# Patient Record
Sex: Female | Born: 1990 | Race: White | Hispanic: No | Marital: Married | Smoking: Former smoker
Health system: Southern US, Community
[De-identification: ages and names within clinical notes are randomized; demographics above are authoritative.]

## PROBLEM LIST (undated history)

## (undated) ENCOUNTER — Inpatient Hospital Stay (HOSPITAL_COMMUNITY): Payer: Self-pay

## (undated) DIAGNOSIS — R519 Headache, unspecified: Secondary | ICD-10-CM

## (undated) DIAGNOSIS — L409 Psoriasis, unspecified: Secondary | ICD-10-CM

## (undated) DIAGNOSIS — K759 Inflammatory liver disease, unspecified: Secondary | ICD-10-CM

## (undated) DIAGNOSIS — E876 Hypokalemia: Secondary | ICD-10-CM

## (undated) DIAGNOSIS — R7989 Other specified abnormal findings of blood chemistry: Secondary | ICD-10-CM

## (undated) HISTORY — DX: Hypokalemia: E87.6

## (undated) HISTORY — DX: Inflammatory liver disease, unspecified: K75.9

## (undated) HISTORY — DX: Psoriasis, unspecified: L40.9

## (undated) HISTORY — DX: Other specified abnormal findings of blood chemistry: R79.89

## (undated) HISTORY — PX: NO PAST SURGERIES: SHX2092

---
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
## 2018-07-07 ENCOUNTER — Encounter: Payer: Self-pay | Admitting: Family Medicine

## 2018-07-07 ENCOUNTER — Ambulatory Visit
Admission: EM | Admit: 2018-07-07 | Discharge: 2018-07-07 | Disposition: A | Discharge status: Home / Self Care | Payer: 59 | Attending: Family Medicine | Admitting: Family Medicine

## 2018-07-07 ENCOUNTER — Other Ambulatory Visit: Payer: Self-pay

## 2018-07-07 DIAGNOSIS — J40 Bronchitis, not specified as acute or chronic: Secondary | ICD-10-CM | POA: Diagnosis not present

## 2018-07-07 DIAGNOSIS — J111 Influenza due to unidentified influenza virus with other respiratory manifestations: Secondary | ICD-10-CM

## 2018-07-07 DIAGNOSIS — H65113 Acute and subacute allergic otitis media (mucoid) (sanguinous) (serous), bilateral: Secondary | ICD-10-CM

## 2018-07-07 DIAGNOSIS — R69 Illness, unspecified: Principal | ICD-10-CM

## 2018-07-07 MED ORDER — PREDNISONE 20 MG PO TABS: ORAL_TABLET | ORAL | 0 refills | Status: DC

## 2018-07-07 MED ORDER — AMOXICILLIN 875 MG PO TABS: 875.0000 mg | ORAL_TABLET | Freq: Two times a day (BID) | ORAL | 0 refills | Status: DC

## 2018-07-07 MED ORDER — BENZONATATE 100 MG PO CAPS: 100.0000 mg | ORAL_CAPSULE | Freq: Three times a day (TID) | ORAL | 0 refills | Status: DC | PRN

## 2018-07-07 NOTE — ED Provider Notes (Signed)
[facility]    CSN: [id number] Arrival date & time: 07/07/18  1003     History   Chief Complaint Chief Complaint  Patient presents with  . Cough    low grade fever  . Nasal Congestion    HPI Heather Lawrence is a 28 y.o. female.   28 yo woman with cough and fever.  Began 3 days ago.  Patient is having fullness in her ears and a violent cough.  She initially had a fever but this is resolved.  Non-smoker and no history of asthma.  Not short of breath but not sleeping well either.  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     History reviewed. No pertinent past medical history.  There are no active problems to display for this patient.   History reviewed. No pertinent surgical history.  OB History   No obstetric history on file.      Home Medications    Prior to Admission medications   Medication Sig Start Date End Date Taking? Authorizing Provider  amoxicillin (AMOXIL) 875 MG tablet Take 1 tablet (875 mg total) by mouth 2 (two) times daily. 07/07/18   Elvina Sidle, MD  benzonatate (TESSALON) 100 MG capsule Take 1-2 capsules (100-200 mg total) by mouth 3 (three) times daily as needed for cough. 07/07/18   Elvina Sidle, MD  predniSONE (DELTASONE) 20 MG tablet Two daily with food 07/07/18   Elvina Sidle, MD    Family History No family history on file.  Social History Social History   Tobacco Use  . Smoking status: Not on file  Substance Use Topics  . Alcohol use: Not on file  . Drug use: Not on file     Allergies   Patient has no allergy information on record.   Review of Systems Review of Systems   Physical Exam Triage Vital Signs ED Triage Vitals  Enc Vitals Group     BP      Pulse      Resp      Temp      Temp src      SpO2      Weight      Height      Head Circumference      Peak Flow      Pain Score      Pain Loc      Pain Edu?      Excl. in GC?    No data found.  Updated Vital  Signs BP 133/81 (BP Location: Right Arm)   Pulse 84   Temp 97.8 F (36.6 C) (Oral)   Resp 16   Ht 5' (1.524 m)   Wt 55.8 kg   LMP  (Exact Date)   SpO2 99%   BMI 24.02 kg/m    Physical Exam Vitals signs and nursing note reviewed.  Constitutional:      General: She is not in acute distress.    Appearance: Normal appearance. She is normal weight.  HENT:     Head: Normocephalic.     Comments: Bilateral mucoid appearing fluid behind each of her TMs    Nose: Congestion present.     Mouth/Throat:     Pharynx: Oropharynx is clear.  Eyes:     Conjunctiva/sclera: Conjunctivae normal.  Neck:     Musculoskeletal: Normal range of motion and neck supple.  Cardiovascular:     Rate and Rhythm: Normal rate and regular rhythm.     Heart sounds:  Normal heart sounds.  Pulmonary:     Effort: Pulmonary effort is normal.     Breath sounds: Normal breath sounds.  Musculoskeletal: Normal range of motion.  Skin:    General: Skin is warm and dry.  Neurological:     General: No focal deficit present.     Mental Status: She is alert.  Psychiatric:        Mood and Affect: Mood normal.      UC Treatments / Results  Labs (all labs ordered are listed, but only abnormal results are displayed) Labs Reviewed - No data to display  EKG None  Radiology No results found.  Procedures Procedures (including critical care time)  Medications Ordered in UC Medications - No data to display  Initial Impression / Assessment and Plan / UC Course  I have reviewed the triage vital signs and the nursing notes.  Pertinent labs & imaging results that were available during my care of the patient were reviewed by me and considered in my medical decision making (see chart for details).    Final Clinical Impressions(s) / UC Diagnoses   Final diagnoses:  Influenza-like illness  Acute mucoid otitis media of both ears  Bronchitis   Discharge Instructions   None    ED Prescriptions    Medication  Sig Dispense Auth. Provider   amoxicillin (AMOXIL) 875 MG tablet Take 1 tablet (875 mg total) by mouth 2 (two) times daily. 20 tablet Elvina Sidle, MD   predniSONE (DELTASONE) 20 MG tablet Two daily with food 10 tablet Elvina Sidle, MD   benzonatate (TESSALON) 100 MG capsule Take 1-2 capsules (100-200 mg total) by mouth 3 (three) times daily as needed for cough. 40 capsule Elvina Sidle, MD     Controlled Substance Prescriptions [place] Controlled Substance Registry consulted? Not Applicable   Elvina Sidle, MD 07/07/18 1039

## 2018-07-07 NOTE — ED Triage Notes (Signed)
Per pt she has been having cough, congestion, body aches and low grade fevers. Pt stated she has been feeling very bad.Not getting any better over the last few days.

## 2019-04-30 DIAGNOSIS — Z3040 Encounter for surveillance of contraceptives, unspecified: Secondary | ICD-10-CM | POA: Diagnosis not present

## 2019-04-30 DIAGNOSIS — Z01419 Encounter for gynecological examination (general) (routine) without abnormal findings: Secondary | ICD-10-CM | POA: Diagnosis not present

## 2019-04-30 DIAGNOSIS — Z1389 Encounter for screening for other disorder: Secondary | ICD-10-CM | POA: Diagnosis not present

## 2019-04-30 DIAGNOSIS — Z6821 Body mass index (BMI) 21.0-21.9, adult: Secondary | ICD-10-CM | POA: Diagnosis not present

## 2019-05-21 ENCOUNTER — Telehealth: Payer: 59 | Admitting: Physician Assistant

## 2019-05-21 DIAGNOSIS — R05 Cough: Secondary | ICD-10-CM | POA: Diagnosis not present

## 2019-05-21 DIAGNOSIS — R059 Cough, unspecified: Secondary | ICD-10-CM

## 2019-05-21 MED ORDER — PREDNISONE 5 MG PO TABS: ORAL_TABLET | ORAL | 0 refills | Status: DC

## 2019-05-21 MED ORDER — BENZONATATE 100 MG PO CAPS: 100.0000 mg | ORAL_CAPSULE | Freq: Three times a day (TID) | ORAL | 0 refills | Status: DC | PRN

## 2019-05-21 NOTE — Progress Notes (Signed)
We are sorry that you are not feeling well.  Here is how we plan to help!  Based on your presentation I believe you most likely have A cough due to a virus.  This is called viral bronchitis and is best treated by rest, plenty of fluids and control of the cough.  You may use Ibuprofen or Tylenol as directed to help your symptoms.     In addition you may use A prescription cough medication called Tessalon Perles 100mg . You may take 1-2 capsules every 8 hours as needed for your cough.  Prednisone 5 mg daily for 6 days (see taper instructions below)  Directions for 6 day taper: Day 1: 2 tablets before breakfast, 1 after both lunch & dinner and 2 at bedtime Day 2: 1 tab before breakfast, 1 after both lunch & dinner and 2 at bedtime Day 3: 1 tab at each meal & 1 at bedtime Day 4: 1 tab at breakfast, 1 at lunch, 1 at bedtime Day 5: 1 tab at breakfast & 1 tab at bedtime Day 6: 1 tab at breakfast   From your responses in the eVisit questionnaire you describe inflammation in the upper respiratory tract which is causing a significant cough.  This is commonly called Bronchitis and has four common causes:    Allergies  Viral Infections  Acid Reflux  Bacterial Infection Allergies, viruses and acid reflux are treated by controlling symptoms or eliminating the cause. An example might be a cough caused by taking certain blood pressure medications. You stop the cough by changing the medication. Another example might be a cough caused by acid reflux. Controlling the reflux helps control the cough.    HOME CARE . Only take medications as instructed by your medical team. . Complete the entire course of an antibiotic. . Drink plenty of fluids and get plenty of rest. . Avoid close contacts especially the very young and the elderly . Cover your mouth if you cough or cough into your sleeve. . Always remember to wash your hands . A steam or ultrasonic humidifier can help congestion.   GET HELP RIGHT AWAY  IF: . You develop worsening fever. . You become short of breath . You cough up blood. . Your symptoms persist after you have completed your treatment plan MAKE SURE YOU   Understand these instructions.  Will watch your condition.  Will get help right away if you are not doing well or get worse.  Your e-visit answers were reviewed by a board certified advanced clinical practitioner to complete your personal care plan.  Depending on the condition, your plan could have included both over the counter or prescription medications. If there is a problem please reply  once you have received a response from your provider. Your safety is important to [place].  If you have drug allergies check your prescription carefully.    You can use MyChart to ask questions about today's visit, request a non-urgent call back, or ask for a work or school excuse for 24 hours related to this e-Visit. If it has been greater than 24 hours you will need to follow up with your provider, or enter a new e-Visit to address those concerns. You will get an e-mail in the next two days asking about your experience.  I hope that your e-visit has been valuable and will speed your recovery. Thank you for using e-visits.  Greater than 5 minutes, yet less than 10 minutes of time have been spent researching, coordinating and  implementing care for this patient today.

## 2020-05-18 ENCOUNTER — Telehealth: Payer: Self-pay | Admitting: Internal Medicine

## 2020-05-18 MED ORDER — AMOXICILLIN 875 MG PO TABS: 875.0000 mg | ORAL_TABLET | Freq: Two times a day (BID) | ORAL | 0 refills | Status: DC

## 2020-05-18 MED ORDER — PREDNISONE 20 MG PO TABS: 40.0000 mg | ORAL_TABLET | Freq: Every day | ORAL | 0 refills | Status: DC

## 2020-05-18 NOTE — Telephone Encounter (Signed)
Bronchitis symptoms persistent. Responds well to steroids/abx in past. Will give trial of tx with instructions to f/u with PCP if no improvement.

## 2020-05-28 DIAGNOSIS — Z1389 Encounter for screening for other disorder: Secondary | ICD-10-CM | POA: Diagnosis not present

## 2020-05-28 DIAGNOSIS — Z1151 Encounter for screening for human papillomavirus (HPV): Secondary | ICD-10-CM | POA: Diagnosis not present

## 2020-05-28 DIAGNOSIS — Z124 Encounter for screening for malignant neoplasm of cervix: Secondary | ICD-10-CM | POA: Diagnosis not present

## 2020-05-28 DIAGNOSIS — Z6822 Body mass index (BMI) 22.0-22.9, adult: Secondary | ICD-10-CM | POA: Diagnosis not present

## 2020-05-28 DIAGNOSIS — Z202 Contact with and (suspected) exposure to infections with a predominantly sexual mode of transmission: Secondary | ICD-10-CM | POA: Diagnosis not present

## 2020-05-28 DIAGNOSIS — Z01419 Encounter for gynecological examination (general) (routine) without abnormal findings: Secondary | ICD-10-CM | POA: Diagnosis not present

## 2020-05-28 DIAGNOSIS — Z113 Encounter for screening for infections with a predominantly sexual mode of transmission: Secondary | ICD-10-CM | POA: Diagnosis not present

## 2020-09-03 DIAGNOSIS — L638 Other alopecia areata: Secondary | ICD-10-CM | POA: Diagnosis not present

## 2020-09-03 DIAGNOSIS — L400 Psoriasis vulgaris: Secondary | ICD-10-CM | POA: Diagnosis not present

## 2020-09-14 ENCOUNTER — Emergency Department (HOSPITAL_COMMUNITY)
Admission: EM | Admit: 2020-09-14 | Discharge: 2020-09-14 | Disposition: A | Discharge status: Home / Self Care | Payer: 59 | Source: Home / Self Care | Attending: Emergency Medicine | Admitting: Emergency Medicine

## 2020-09-14 ENCOUNTER — Emergency Department (HOSPITAL_COMMUNITY): Payer: 59

## 2020-09-14 ENCOUNTER — Inpatient Hospital Stay (HOSPITAL_COMMUNITY)
Admission: EM | Admit: 2020-09-14 | Discharge: 2020-09-15 | DRG: 443 | Disposition: A | Discharge status: Home / Self Care | Payer: 59 | Attending: Internal Medicine | Admitting: Internal Medicine

## 2020-09-14 ENCOUNTER — Other Ambulatory Visit: Payer: Self-pay

## 2020-09-14 ENCOUNTER — Encounter (HOSPITAL_COMMUNITY): Payer: Self-pay

## 2020-09-14 DIAGNOSIS — R112 Nausea with vomiting, unspecified: Secondary | ICD-10-CM | POA: Insufficient documentation

## 2020-09-14 DIAGNOSIS — E861 Hypovolemia: Secondary | ICD-10-CM | POA: Diagnosis present

## 2020-09-14 DIAGNOSIS — R1011 Right upper quadrant pain: Secondary | ICD-10-CM | POA: Diagnosis not present

## 2020-09-14 DIAGNOSIS — R197 Diarrhea, unspecified: Secondary | ICD-10-CM | POA: Insufficient documentation

## 2020-09-14 DIAGNOSIS — R509 Fever, unspecified: Secondary | ICD-10-CM | POA: Insufficient documentation

## 2020-09-14 DIAGNOSIS — E860 Dehydration: Secondary | ICD-10-CM | POA: Diagnosis not present

## 2020-09-14 DIAGNOSIS — R748 Abnormal levels of other serum enzymes: Secondary | ICD-10-CM

## 2020-09-14 DIAGNOSIS — I959 Hypotension, unspecified: Secondary | ICD-10-CM | POA: Diagnosis present

## 2020-09-14 DIAGNOSIS — K7200 Acute and subacute hepatic failure without coma: Principal | ICD-10-CM | POA: Diagnosis present

## 2020-09-14 DIAGNOSIS — L409 Psoriasis, unspecified: Secondary | ICD-10-CM | POA: Diagnosis not present

## 2020-09-14 DIAGNOSIS — Z20822 Contact with and (suspected) exposure to covid-19: Secondary | ICD-10-CM | POA: Insufficient documentation

## 2020-09-14 DIAGNOSIS — K759 Inflammatory liver disease, unspecified: Secondary | ICD-10-CM

## 2020-09-14 DIAGNOSIS — N83201 Unspecified ovarian cyst, right side: Secondary | ICD-10-CM | POA: Diagnosis present

## 2020-09-14 DIAGNOSIS — E876 Hypokalemia: Secondary | ICD-10-CM | POA: Diagnosis not present

## 2020-09-14 DIAGNOSIS — D72829 Elevated white blood cell count, unspecified: Secondary | ICD-10-CM | POA: Diagnosis present

## 2020-09-14 DIAGNOSIS — R1010 Upper abdominal pain, unspecified: Secondary | ICD-10-CM | POA: Diagnosis not present

## 2020-09-14 DIAGNOSIS — F17290 Nicotine dependence, other tobacco product, uncomplicated: Secondary | ICD-10-CM | POA: Diagnosis present

## 2020-09-14 DIAGNOSIS — A084 Viral intestinal infection, unspecified: Secondary | ICD-10-CM | POA: Diagnosis not present

## 2020-09-14 DIAGNOSIS — R1013 Epigastric pain: Secondary | ICD-10-CM

## 2020-09-14 DIAGNOSIS — R7401 Elevation of levels of liver transaminase levels: Secondary | ICD-10-CM

## 2020-09-14 DIAGNOSIS — R109 Unspecified abdominal pain: Secondary | ICD-10-CM | POA: Diagnosis not present

## 2020-09-14 LAB — URINALYSIS, ROUTINE W REFLEX MICROSCOPIC
Bacteria, UA: NONE SEEN
Bilirubin Urine: NEGATIVE
Bilirubin Urine: NEGATIVE
Glucose, UA: NEGATIVE mg/dL
Glucose, UA: NEGATIVE mg/dL
Ketones, ur: 5 mg/dL — AB
Ketones, ur: NEGATIVE mg/dL
Leukocytes,Ua: NEGATIVE
Leukocytes,Ua: NEGATIVE
Nitrite: NEGATIVE
Nitrite: NEGATIVE
Protein, ur: NEGATIVE mg/dL
Protein, ur: NEGATIVE mg/dL
Specific Gravity, Urine: 1.018 (ref 1.005–1.030)
Specific Gravity, Urine: 1.003 — ABNORMAL LOW (ref 1.005–1.030)
pH: 5 (ref 5.0–8.0)
pH: 7 (ref 5.0–8.0)

## 2020-09-14 LAB — COMPREHENSIVE METABOLIC PANEL
ALT: 64 U/L — ABNORMAL HIGH (ref 0–44)
ALT: 277 U/L — ABNORMAL HIGH (ref 0–44)
AST: 103 U/L — ABNORMAL HIGH (ref 15–41)
AST: 336 U/L — ABNORMAL HIGH (ref 15–41)
Albumin: 3.7 g/dL (ref 3.5–5.0)
Albumin: 3.6 g/dL (ref 3.5–5.0)
Alkaline Phosphatase: 53 U/L (ref 38–126)
Alkaline Phosphatase: 91 U/L (ref 38–126)
Anion gap: 8 (ref 5–15)
Anion gap: 9 (ref 5–15)
BUN: 5 mg/dL — ABNORMAL LOW (ref 6–20)
BUN: <5 mg/dL — ABNORMAL LOW (ref 6–20)
CO2: 28 mmol/L (ref 22–32)
CO2: 23 mmol/L (ref 22–32)
Calcium: 8.5 mg/dL — ABNORMAL LOW (ref 8.9–10.3)
Calcium: 8.8 mg/dL — ABNORMAL LOW (ref 8.9–10.3)
Chloride: 100 mmol/L (ref 98–111)
Chloride: 105 mmol/L (ref 98–111)
Creatinine, Ser: 0.98 mg/dL (ref 0.44–1.00)
Creatinine, Ser: 0.88 mg/dL (ref 0.44–1.00)
GFR, Estimated: >60 mL/min (ref >60)
GFR, Estimated: >60 mL/min (ref >60)
Glucose, Bld: 94 mg/dL (ref 70–99)
Glucose, Bld: 112 mg/dL — ABNORMAL HIGH (ref 70–99)
Potassium: 3.2 mmol/L — ABNORMAL LOW (ref 3.5–5.1)
Potassium: 3.8 mmol/L (ref 3.5–5.1)
Sodium: 136 mmol/L (ref 135–145)
Sodium: 137 mmol/L (ref 135–145)
Total Bilirubin: 0.7 mg/dL (ref 0.3–1.2)
Total Bilirubin: 0.9 mg/dL (ref 0.3–1.2)
Total Protein: 6.5 g/dL (ref 6.5–8.1)
Total Protein: 6.9 g/dL (ref 6.5–8.1)

## 2020-09-14 LAB — CBC
HCT: 42.7 % (ref 36.0–46.0)
Hemoglobin: 14.5 g/dL (ref 12.0–15.0)
MCH: 31.7 pg (ref 26.0–34.0)
MCHC: 34 g/dL (ref 30.0–36.0)
MCV: 93.2 fL (ref 80.0–100.0)
Platelets: 179 10*3/uL (ref 150–400)
RBC: 4.58 MIL/uL (ref 3.87–5.11)
RDW: 11.8 % (ref 11.5–15.5)
WBC: 8.3 10*3/uL (ref 4.0–10.5)
nRBC: 0 % (ref 0.0–0.2)

## 2020-09-14 LAB — CBC WITH DIFFERENTIAL/PLATELET
Abs Immature Granulocytes: 0.03 10*3/uL (ref 0.00–0.07)
Basophils Absolute: 0 10*3/uL (ref 0.0–0.1)
Basophils Relative: 0 %
Eosinophils Absolute: 0.1 10*3/uL (ref 0.0–0.5)
Eosinophils Relative: 1 %
HCT: 42.3 % (ref 36.0–46.0)
Hemoglobin: 14.7 g/dL (ref 12.0–15.0)
Immature Granulocytes: 0 %
Lymphocytes Relative: 13 %
Lymphs Abs: 1.4 10*3/uL (ref 0.7–4.0)
MCH: 32 pg (ref 26.0–34.0)
MCHC: 34.8 g/dL (ref 30.0–36.0)
MCV: 92.2 fL (ref 80.0–100.0)
Monocytes Absolute: 0.6 10*3/uL (ref 0.1–1.0)
Monocytes Relative: 6 %
Neutro Abs: 9.3 10*3/uL — ABNORMAL HIGH (ref 1.7–7.7)
Neutrophils Relative %: 80 %
Platelets: 199 10*3/uL (ref 150–400)
RBC: 4.59 MIL/uL (ref 3.87–5.11)
RDW: 11.8 % (ref 11.5–15.5)
WBC: 11.5 10*3/uL — ABNORMAL HIGH (ref 4.0–10.5)
nRBC: 0 % (ref 0.0–0.2)

## 2020-09-14 LAB — I-STAT BETA HCG BLOOD, ED (MC, WL, AP ONLY)
I-stat hCG, quantitative: <5 m[IU]/mL (ref <5)
I-stat hCG, quantitative: <5 m[IU]/mL (ref <5)

## 2020-09-14 LAB — PROTIME-INR
INR: 1.2 (ref 0.8–1.2)
Prothrombin Time: 15.6 seconds — ABNORMAL HIGH (ref 11.4–15.2)

## 2020-09-14 LAB — MONONUCLEOSIS SCREEN: Mono Screen: NEGATIVE

## 2020-09-14 LAB — RESP PANEL BY RT-PCR (FLU A&B, COVID) ARPGX2
Influenza A by PCR: NEGATIVE
Influenza B by PCR: NEGATIVE
SARS Coronavirus 2 by RT PCR: NEGATIVE

## 2020-09-14 LAB — HEPATITIS PANEL, ACUTE
HCV Ab: NONREACTIVE
Hep A IgM: NONREACTIVE
Hep B C IgM: NONREACTIVE
Hepatitis B Surface Ag: NONREACTIVE

## 2020-09-14 LAB — HIV ANTIBODY (ROUTINE TESTING W REFLEX): HIV Screen 4th Generation wRfx: NONREACTIVE

## 2020-09-14 LAB — LIPASE, BLOOD
Lipase: 32 U/L (ref 11–51)
Lipase: 30 U/L (ref 11–51)

## 2020-09-14 MED ORDER — SODIUM CHLORIDE 0.9 % IV SOLN: INTRAVENOUS | Status: DC

## 2020-09-14 MED ORDER — ACETAMINOPHEN 500 MG PO TABS: 500.0000 mg | ORAL_TABLET | Freq: Four times a day (QID) | ORAL | Status: DC | PRN

## 2020-09-14 MED ORDER — DOXYCYCLINE HYCLATE 100 MG PO TABS
100.0000 mg | ORAL_TABLET | Freq: Once | ORAL | Status: AC
  Administered 2020-09-14: 100 mg via ORAL
  Filled 2020-09-14: qty 1

## 2020-09-14 MED ORDER — MORPHINE SULFATE (PF) 2 MG/ML IV SOLN: 1.0000 mg | INTRAVENOUS | Status: DC | PRN

## 2020-09-14 MED ORDER — ONDANSETRON HCL 4 MG PO TABS: 4.0000 mg | ORAL_TABLET | Freq: Four times a day (QID) | ORAL | Status: DC | PRN

## 2020-09-14 MED ORDER — ONDANSETRON HCL 4 MG/2ML IJ SOLN
4.0000 mg | Freq: Four times a day (QID) | INTRAMUSCULAR | Status: DC | PRN
  Administered 2020-09-14: 4 mg via INTRAVENOUS
  Filled 2020-09-14: qty 2

## 2020-09-14 MED ORDER — FAMOTIDINE IN NACL 20-0.9 MG/50ML-% IV SOLN
20.0000 mg | Freq: Once | INTRAVENOUS | Status: AC
  Administered 2020-09-14: 20 mg via INTRAVENOUS
  Filled 2020-09-14: qty 50

## 2020-09-14 MED ORDER — ACETAMINOPHEN 650 MG RE SUPP: 650.0000 mg | Freq: Four times a day (QID) | RECTAL | Status: DC | PRN

## 2020-09-14 MED ORDER — ONDANSETRON HCL 4 MG/2ML IJ SOLN: 4.0000 mg | Freq: Four times a day (QID) | INTRAMUSCULAR | Status: DC | PRN

## 2020-09-14 MED ORDER — ACETAMINOPHEN 500 MG PO TABS
1000.0000 mg | ORAL_TABLET | Freq: Once | ORAL | Status: AC
  Administered 2020-09-14: 1000 mg via ORAL
  Filled 2020-09-14: qty 2

## 2020-09-14 MED ORDER — SODIUM CHLORIDE 0.9 % IV BOLUS
1000.0000 mL | Freq: Once | INTRAVENOUS | Status: AC
  Administered 2020-09-14: 1000 mL via INTRAVENOUS

## 2020-09-14 MED ORDER — FENTANYL CITRATE (PF) 100 MCG/2ML IJ SOLN: 50.0000 ug | INTRAMUSCULAR | Status: DC | PRN

## 2020-09-14 MED ORDER — BENZONATATE 100 MG PO CAPS
100.0000 mg | ORAL_CAPSULE | Freq: Three times a day (TID) | ORAL | Status: DC | PRN
Start: 2020-09-14 — End: 2020-09-14

## 2020-09-14 MED ORDER — ACETAMINOPHEN 325 MG PO TABS: 650.0000 mg | ORAL_TABLET | Freq: Four times a day (QID) | ORAL | Status: DC | PRN

## 2020-09-14 MED ORDER — ONDANSETRON 4 MG PO TBDP: 4.0000 mg | ORAL_TABLET | Freq: Three times a day (TID) | ORAL | 0 refills | Status: DC | PRN

## 2020-09-14 MED ORDER — ENOXAPARIN SODIUM 40 MG/0.4ML ~~LOC~~ SOLN: 40.0000 mg | SUBCUTANEOUS | Status: DC

## 2020-09-14 MED ORDER — POTASSIUM CHLORIDE CRYS ER 20 MEQ PO TBCR
40.0000 meq | EXTENDED_RELEASE_TABLET | Freq: Once | ORAL | Status: AC
  Administered 2020-09-14: 40 meq via ORAL
  Filled 2020-09-14: qty 2

## 2020-09-14 MED ORDER — PANTOPRAZOLE SODIUM 40 MG PO TBEC
40.0000 mg | DELAYED_RELEASE_TABLET | Freq: Every day | ORAL | Status: DC
  Administered 2020-09-14 – 2020-09-15 (×2): 40 mg via ORAL
  Filled 2020-09-14 (×2): qty 1

## 2020-09-14 MED ORDER — IOHEXOL 300 MG/ML  SOLN
100.0000 mL | Freq: Once | INTRAMUSCULAR | Status: AC | PRN
  Administered 2020-09-14: 100 mL via INTRAVENOUS

## 2020-09-14 NOTE — ED Triage Notes (Signed)
Emergency Medicine Provider Triage Evaluation Note  Heather Lawrence , a 30 y.o. female  was evaluated in triage.  Pt complains of upper abd pain x 5 days. Was seen yesterday for same  Review of Systems  Positive: N/v/d, fever Negative: Cp, sob, cough  Physical Exam  BP 133/79 (BP Location: Right Arm)   Pulse (!) 110   Temp (!) 101.8 F (38.8 C) (Oral)   Resp 16   SpO2 97%  Gen:   Awake, no distress   HEENT:  Atraumatic  Resp:  Normal effort  Cardiac:  Normal rate  Abd:   RUQ and epigastric tenderness MSK:   Moves extremities without difficulty  Neuro:  Speech clear   Medical Decision Making  Medically screening exam initiated at 9:21 AM.  Appropriate orders placed.  JHORDAN KINTER was informed that the remainder of the evaluation will be completed by another provider, this initial triage assessment does not replace that evaluation, and the importance of remaining in the ED until their evaluation is complete.  Clinical Impression  abd pain, n/v/d, fever.    Fayrene Helper, PA-C 09/14/20 [phone]

## 2020-09-14 NOTE — ED Notes (Signed)
Pt reports left eyelid appears swollen.

## 2020-09-14 NOTE — ED Provider Notes (Signed)
5:25 PM signout from [employer] at shift change.  Patient awaiting GI recommendations for suspected unspecified hepatitis.  Patient rechecked and is feeling better, requesting something to eat.  I spoke with Dr. Ella Jubilee of [facility] hospitalist who will see patient for admission.  BP 106/69   Pulse 74   Temp 97.6 F (36.4 C) (Axillary) Comment (Src): Pt just drank ice water  Resp 19   SpO2 99%     Renne Crigler, PA-C 09/14/20 1726    Cheryll Cockayne, MD 09/14/20 1755

## 2020-09-14 NOTE — Discharge Instructions (Signed)
Your ultrasound tonight revealed no evidence of gallstones or gallbladder infection.  You had mildly elevated liver enzymes.  I suggest that you have these rechecked later this week by your primary care doctor to ensure that they are trending down.  If your pain or symptoms return, you should return to the emergency department for CT imaging of your abdomen.  Please take medications as prescribed.

## 2020-09-14 NOTE — ED Provider Notes (Signed)
[facility] EMERGENCY DEPARTMENT Provider Note   CSN: [id number] Arrival date & time: 09/14/20  9563     History Chief Complaint  Patient presents with  . Abdominal Pain    Heather Lawrence is a 30 y.o. female.  Heather Lawrence is a 30 y.o. female who is otherwise healthy, returns to the emergency department for evaluation of worsening abdominal pain.  She was seen in the ED last night after she developed severe epigastric pain around 8 PM, had been having nausea, vomiting and diarrhea starting on Friday morning with fever of 101-102 at home.  Had been taking Tylenol, but takes this at appropriate doses never exceeding 2000 mg/day.  She reports pain seems to come and go, is worse with eating, she had a reassuring work-up last night with lab work significant for very mild elevation in LFTs, but normal right upper quadrant ultrasound.  Was discharged home but after getting home this morning her pain started to worsen again, her fever returned and she was feeling much worse.  No lower abdominal pain, no blood in the stool, no dysuria, vaginal discharge or vaginal bleeding.  She has been very nauseated with some vomiting.  She reports she typically drinks 1-2 beers daily but has not drank at all over the past few days while she has been feeling poorly reports no increase in alcohol intake prior to the symptoms starting.  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  No other aggravating or alleviating factors.        No past medical history on file.  There are no problems to display for this patient.   No past surgical history on file.   OB History   No obstetric history on file.     No family history on file.  Social History   Tobacco Use  . Smoking status: Never Smoker  . Smokeless  tobacco: Never Used  Vaping Use  . Vaping Use: Every day  Substance Use Topics  . Alcohol use: Not Currently  . Drug use: Never    Home Medications Prior to Admission medications   Medication Sig Start Date End Date Taking? Authorizing Provider  amoxicillin (AMOXIL) 875 MG tablet Take 1 tablet (875 mg total) by mouth 2 (two) times daily. 05/18/20   Candee Furbish, MD  benzonatate (TESSALON) 100 MG capsule Take 1-2 capsules (100-200 mg total) by mouth 3 (three) times daily as needed for cough. 05/21/19   McVey, Gelene Mink, PA-C  ondansetron (ZOFRAN ODT) 4 MG disintegrating tablet Take 1 tablet (4 mg total) by mouth every 8 (eight) hours as needed for nausea or vomiting. 09/14/20   Montine Circle, PA-C  predniSONE (DELTASONE) 20 MG tablet Take 2 tablets (40 mg total) by mouth daily with breakfast. 05/18/20   Candee Furbish, MD    Allergies    Patient has no known allergies.  Review of Systems   Review of Systems  Constitutional: Positive for chills and fever.  HENT: Negative.   Respiratory: Negative for cough and shortness of breath.   Cardiovascular: Negative for chest pain.  Gastrointestinal: Positive for abdominal pain, diarrhea, nausea and vomiting. Negative for blood in stool.  Genitourinary: Negative for dysuria, frequency, pelvic pain, vaginal bleeding and vaginal discharge.  Musculoskeletal: Negative for arthralgias and myalgias.  Skin: Negative for color change and rash.  Neurological: Negative for headaches.    Physical Exam Updated Vital Signs BP 133/79   Pulse 110   Temp 101.8 F (38.8 C) Resp 16   SpO2 97%   Physical Exam Vitals and nursing note reviewed.  Constitutional:      General: She is not in acute distress.    Appearance: She is well-developed. She is not diaphoretic.     Comments: Alert, well-appearing and in no acute distress  HENT:     Head: Normocephalic and atraumatic.     Mouth/Throat:     Mouth: Mucous membranes are moist.      Pharynx: Oropharynx is clear.  Eyes:     General:        Right eye: No discharge.        Left eye: No discharge.     Pupils: Pupils are equal, round, and reactive to light.  Cardiovascular:     Rate and Rhythm: Regular rhythm. Tachycardia present.     Heart sounds: Normal heart sounds. No murmur heard. No friction rub. No gallop.   Pulmonary:     Effort: Pulmonary effort is normal. No respiratory distress.     Breath sounds: Normal breath sounds. No wheezing or rales.     Comments: Respirations equal and unlabored, patient able to speak in full sentences, lungs clear to auscultation bilaterally  Abdominal:     General: Bowel sounds are normal. There is no distension.     Palpations: Abdomen is soft. There is no mass.     Tenderness: There is abdominal tenderness in the right upper quadrant and epigastric area. There is no guarding.     Comments: Abdomen is soft, nondistended, bowel sounds present throughout, there is mild tenderness in the epigastric and right upper quadrants without guarding, all other quadrants nontender to palpation, negative Murphy sign  Musculoskeletal:        General: No deformity.     Cervical back: Neck supple.  Skin:    General: Skin is warm and dry.     Capillary Refill: Capillary refill takes less than 2 seconds.  Neurological:     Mental Status: She is alert and oriented to person, place, and time.     Coordination: Coordination normal.     Comments: Speech is clear, able to follow commands Moves extremities without ataxia, coordination intact  Psychiatric:        Mood and Affect: Mood normal.        Behavior: Behavior normal.     ED Results / Procedures / Treatments   Labs (all labs ordered are listed, but only abnormal results are displayed) Labs Reviewed  CBC WITH DIFFERENTIAL/PLATELET - Abnormal; Notable for the following components:      Result Value   WBC 11.5 (*)    Neutro Abs 9.3 (*)    All other components within normal limits   COMPREHENSIVE METABOLIC PANEL - Abnormal; Notable for the following components:   Glucose, Bld 112 (*)    BUN <5 (*)    Calcium 8.8 (*)    AST 336 (*)    ALT 277 (*)    All other components within normal limits  URINALYSIS, ROUTINE W REFLEX MICROSCOPIC - Abnormal; Notable for the following components:   Color, Urine STRAW (*)    Specific Gravity, Urine 1.003 (*)    Hgb urine dipstick SMALL (*)    All other components within normal limits  LIPASE, BLOOD  HEPATITIS PANEL, ACUTE  PROTIME-INR  I-STAT BETA HCG BLOOD, ED ([facility], [facility], [facility] ONLY)    EKG None  Radiology CT ABDOMEN PELVIS W CONTRAST  Result Date: 09/14/2020 CLINICAL DATA:  Abdominal pain EXAM: CT ABDOMEN AND PELVIS WITH CONTRAST TECHNIQUE: Multidetector CT imaging of the abdomen and pelvis was performed using the standard protocol following bolus administration of intravenous contrast. CONTRAST:  16m OMNIPAQUE IOHEXOL 300 MG/ML  SOLN COMPARISON:  None. FINDINGS: Lower chest: No acute abnormality. Hepatobiliary: Too small to characterize low-attenuation lesion of the inferior right hepatic lobe. Gallbladder is unremarkable. No biliary dilatation. Pancreas: Unremarkable. Spleen: Unremarkable. Adrenals/Urinary Tract: Adrenals, kidneys, and bladder are unremarkable. Stomach/Bowel: Stomach is within normal limits. Bowel is normal in caliber. Vascular/Lymphatic: No significant vascular abnormality. No enlarged lymph nodes. Reproductive: Uterus is unremarkable. Intrauterine device is present. Right adnexal/ovarian hypoattenuating lesion measures up to 5.3 cm. Other: No free fluid.  Abdominal wall is unremarkable. Musculoskeletal: No acute osseous abnormality. IMPRESSION: Right ovarian cystic lesion measures up to 5.3 cm. Follow-up by [place] recommended in 3-6 months. Reference: JACR 2020 Feb; 17(2):248-254 Otherwise unremarkable study. Electronically Signed   By: PMacy MisM.D.   On: 09/14/2020 11:49   [place]  Result Date:  09/14/2020 CLINICAL DATA:  Right upper quadrant pain. EXAM: ULTRASOUND ABDOMEN LIMITED RIGHT UPPER QUADRANT COMPARISON:  01/18/2017 FINDINGS: Gallbladder: No gallstones or wall thickening visualized. No sonographic Murphy sign noted by sonographer. Common bile duct: Diameter: 4.5 mm, normal Liver: No focal lesion identified. Within normal limits in parenchymal echogenicity. Portal vein is patent on color Doppler imaging with normal direction of blood flow towards the liver. Other: None. IMPRESSION: No evidence of cholelithiasis or acute cholecystitis. Electronically Signed   By: WLucienne CapersM.D.   On: 09/14/2020 03:47    Procedures Procedures   Medications Ordered in ED Medications  pantoprazole (PROTONIX) EC tablet 40 mg (40 mg Oral Given 09/14/20 1447)  sodium chloride 0.9 % bolus 1,000 mL (0 mLs Intravenous Stopped 09/14/20 1145)  famotidine (PEPCID) IVPB 20 mg premix (0 mg Intravenous Stopped 09/14/20 1145)  acetaminophen (TYLENOL) tablet 1,000 mg (1,000 mg Oral Given 09/14/20 1104)  iohexol (OMNIPAQUE) 300 MG/ML solution 100 mL (100 mLs Intravenous Contrast Given 09/14/20 1121)    ED Course  I have reviewed the triage vital signs and the nursing notes.  Pertinent labs & imaging results that were available during my care of the patient were reviewed by me and considered in my medical decision making (see chart for details).    MDM Rules/Calculators/A&P                         Patient presents to the ED with complaints of abdominal pain. Patient nontoxic appearing, in no apparent distress, patient febrile and mildly tachycardic, vitals otherwise unremarkable. On exam patient tender to palpation in epigastric region and there is some mild right upper quadrant tenderness, no peritoneal signs.  Labs and CT abdomen pelvis ordered from triage given persistent and worsening symptoms despite work-up in the ED last night with reassuring right upper quadrant ultrasound.  IV fluids, Pepcid and  Tylenol ordered  Additional history obtained:  Additional history obtained from chart review & nursing note review.   Lab Tests:  I Ordered, reviewed, and interpreted labs, which included:  CBC: Mild leukocytosis of 11.5, increased from lab work done last night, normal hemoglobin CMP: Mild hypocalcemia and glucose of 112 but no other concerning electrolyte derangements and normal renal function.  Patient's LFTs  have continued to increase from last night, AST was 103 and has increased to 336, ALT was 64, increased to 277, normal bilirubin and alk phos Lipase: WNL UA: No hematuria or signs of infection Preg test: Negative  COVID/flu testing was done overnight and was negative  Imaging Studies ordered:  I ordered imaging studies which included CT abdomen pelvis, I independently reviewed, formal radiology impression shows:  No acute abnormalities, incidental right ovarian cyst noted, patient reports she has history of frequent ovarian cyst, this is not the location of her pain today.  No obvious signs of hepatobiliary disease  ED Course:   Concern for worsening transaminitis with right upper quadrant pain and fever, patient without common bile duct or increased bilirubin to suggest obstruction, patient without jaundice but with right upper quadrant pain and fever, consider acute hepatitis, reactive hepatitis titers.  Will consult and discussed with GI.   RE-EVAL: Nausea improved, patient reports pain has remained under control, fever resolved, discussed plan with patient, waiting on final recommendations from GI, she expresses understanding and agreement.  I discussed case with PA Azucena Freed with GI, they will see and evaluate the patient and make recommendations on disposition and care.  At shift change final recommendations from GI are pending, care signed out to [place] who will follow up on GI recommendations and disposition appropriately..   Portions of this note were generated  with [employer]. Dictation errors may occur despite best attempts at proofreading.      Final Clinical Impression(s) / ED Diagnoses Final diagnoses:  Transaminitis  Epigastric pain  RUQ pain  Fever, unspecified fever cause    Rx / DC Orders ED Discharge Orders    None       Janet Berlin 09/14/20 1620    Luna Fuse, MD 09/14/20 1755

## 2020-09-14 NOTE — ED Notes (Signed)
Pt ambulated self to bathroom.  

## 2020-09-14 NOTE — ED Notes (Signed)
E-signature pad unavailable at time of pt discharge. This RN discussed discharge materials with pt and answered all pt questions. Pt stated understanding of discharge material. ? ?

## 2020-09-14 NOTE — H&P (Addendum)
History and Physical    Heather Lawrence DPO:[id number] DOB: July 29, 1990 DOA: 09/14/2020  PCP: Patient, No Pcp Per (Inactive)   Patient coming from: Home   Chief Complaint: abdominal pain.   HPI: Heather Lawrence is a 30 y.o. female with past medical history of psoriasis, how presents with abdominal pain, diarrhea, nausea and vomiting.   Patient was at her usual state of health until 4 days ago when late in the evening she started experiencing nausea, vomiting and diarrhea.  She had multiple episodes of watery diarrhea, associated with generalized weakness, poor oral intake and intermittent fever.  No improving or worsening factors.  Through the course of the following days she started experiencing abdominal pain, moderate to severe intensity, dull in nature, no improving or worsening factors.  About 2 days ago she was seen at a virtual visit and had ondansetron prescribed, with not much improvement of her symptoms.  Patient came to the ED on 4/25 around 1 AM, she was evaluated, found to have elevated liver function testing, ultrasonography showed no signs of gallbladder disease, with supportive therapy her symptoms improved and she was discharged home. At home she had recurrence of her symptoms and returned about 5 hours later to the ED.   Last week she has been outdoors in the woods, positive tick exposure, insects were removed before attachment, denies any rash associated. She has been eating out, including seafood about 6 days ago. No over the counter medications, tylenol or alcohol.  She is accompanied by her partner who has been exposed to similar foods and environments but he has not got sick.   ED Course: Patient received intravenous fluids, doxycycline, and acetaminophen.  Liver enzymes continue to be elevated, she was referred for admission for evaluation.  Review of Systems:  1. General: intermittent fevers yesterday and 72 hrs ago with no chills, no weight gain or weight loss 2.  ENT: No runny nose or sore throat, no hearing disturbances 3. Pulmonary: No dyspnea, cough, wheezing, or hemoptysis 4. Cardiovascular: No angina, claudication, lower extremity edema, pnd or orthopnea 5. Gastrointestinal: positive for nausea, vomiting, and diarrhea as mentioned in HPI 6. Hematology: No easy bruisability or frequent infections 7. Urology: No dysuria, hematuria or increased urinary frequency 8. Dermatology: chronic psoriasis. Marland Kitchen 9. Neurology: No seizures or paresthesias 10. Musculoskeletal: No joint pain or deformities  No past medical history on file.  No past surgical history on file.   reports that she has never smoked. She has never used smokeless tobacco. She reports previous alcohol use. She reports that she does not use drugs.  No Known Allergies  No family history on file.   Prior to Admission medications   Medication Sig Start Date End Date Taking? Authorizing Provider  amoxicillin (AMOXIL) 875 MG tablet Take 1 tablet (875 mg total) by mouth 2 (two) times daily. 05/18/20   Lorin Glass, MD  benzonatate (TESSALON) 100 MG capsule Take 1-2 capsules (100-200 mg total) by mouth 3 (three) times daily as needed for cough. 05/21/19   McVey, Madelaine Bhat, PA-C  ondansetron (ZOFRAN ODT) 4 MG disintegrating tablet Take 1 tablet (4 mg total) by mouth every 8 (eight) hours as needed for nausea or vomiting. 09/14/20   Roxy Horseman, PA-C  predniSONE (DELTASONE) 20 MG tablet Take 2 tablets (40 mg total) by mouth daily with breakfast. 05/18/20   Lorin Glass, MD    Physical Exam: Vitals:   09/14/20 1542 09/14/20 1630 09/14/20 1645 09/14/20 1700  BP: 94/60  106/69    Pulse: 80 82 79 74  Resp: 20 18 (!) 24 19  Temp:      TempSrc:      SpO2: 99% 99% 99% 99%    Vitals:   09/14/20 1542 09/14/20 1630 09/14/20 1645 09/14/20 1700  BP: 94/60 106/69    Pulse: 80 82 79 74  Resp: 20 18 (!) 24 19  Temp:      TempSrc:      SpO2: 99% 99% 99% 99%   General:  deconditioned  Neurology: Awake and alert, non focal Head and Neck. Head normocephalic. Neck supple with no adenopathy or thyromegaly.   E ENT: mild pallor, no icterus, oral mucosa dry Cardiovascular: No JVD. S1-S2 present, rhythmic, no gallops, rubs, or murmurs. No lower extremity edema. Pulmonary: positive breath sounds bilaterally, adequate air movement, no wheezing, rhonchi or rales. Gastrointestinal. Abdomen soft, non tender to superficial palpation. Skin. No rashes Musculoskeletal: no joint deformities    Labs on Admission: I have personally reviewed following labs and imaging studies  CBC: Recent Labs  Lab 09/14/20 0133 09/14/20 0925  WBC 8.3 11.5*  NEUTROABS  --  9.3*  HGB 14.5 14.7  HCT 42.7 42.3  MCV 93.2 92.2  PLT 179 199   Basic Metabolic Panel: Recent Labs  Lab 09/14/20 0133 09/14/20 0925  NA 136 137  K 3.2* 3.8  CL 100 105  CO2 28 23  GLUCOSE 94 112*  BUN 5* <5*  CREATININE 0.98 0.88  CALCIUM 8.5* 8.8*   GFR: Estimated Creatinine Clearance: 74.5 mL/min (by C-G formula based on SCr of 0.88 mg/dL). Liver Function Tests: Recent Labs  Lab 09/14/20 0133 09/14/20 0925  AST 103* 336*  ALT 64* 277*  ALKPHOS 53 91  BILITOT 0.7 0.9  PROT 6.5 6.9  ALBUMIN 3.7 3.6   Recent Labs  Lab 09/14/20 0133 09/14/20 0925  LIPASE 32 30   No results for input(s): AMMONIA in the last 168 hours. Coagulation Profile: No results for input(s): INR, PROTIME in the last 168 hours. Cardiac Enzymes: No results for input(s): CKTOTAL, CKMB, CKMBINDEX, TROPONINI in the last 168 hours. BNP (last 3 results) No results for input(s): PROBNP in the last 8760 hours. HbA1C: No results for input(s): HGBA1C in the last 72 hours. CBG: No results for input(s): GLUCAP in the last 168 hours. Lipid Profile: No results for input(s): CHOL, HDL, LDLCALC, TRIG, CHOLHDL, LDLDIRECT in the last 72 hours. Thyroid Function Tests: No results for input(s): TSH, T4TOTAL, FREET4, T3FREE,  THYROIDAB in the last 72 hours. Anemia Panel: No results for input(s): VITAMINB12, FOLATE, FERRITIN, TIBC, IRON, RETICCTPCT in the last 72 hours. Urine analysis:    Component Value Date/Time   COLORURINE STRAW (A) 09/14/2020 1105   APPEARANCEUR CLEAR 09/14/2020 1105   LABSPEC 1.003 (L) 09/14/2020 1105   PHURINE 7.0 09/14/2020 1105   GLUCOSEU NEGATIVE 09/14/2020 1105   HGBUR SMALL (A) 09/14/2020 1105   BILIRUBINUR NEGATIVE 09/14/2020 1105   KETONESUR NEGATIVE 09/14/2020 1105   PROTEINUR NEGATIVE 09/14/2020 1105   NITRITE NEGATIVE 09/14/2020 1105   LEUKOCYTESUR NEGATIVE 09/14/2020 1105    Radiological Exams on Admission: CT ABDOMEN PELVIS W CONTRAST  Result Date: 09/14/2020 CLINICAL DATA:  Abdominal pain EXAM: CT ABDOMEN AND PELVIS WITH CONTRAST TECHNIQUE: Multidetector CT imaging of the abdomen and pelvis was performed using the standard protocol following bolus administration of intravenous contrast. CONTRAST:  OMNIPAQUE IOHEXOL 300 MG/ML  SOLN COMPARISON:  None. FINDINGS: Lower chest: No acute abnormality. Hepatobiliary: Too small  to characterize low-attenuation lesion of the inferior right hepatic lobe. Gallbladder is unremarkable. No biliary dilatation. Pancreas: Unremarkable. Spleen: Unremarkable. Adrenals/Urinary Tract: Adrenals, kidneys, and bladder are unremarkable. Stomach/Bowel: Stomach is within normal limits. Bowel is normal in caliber. Vascular/Lymphatic: No significant vascular abnormality. No enlarged lymph nodes. Reproductive: Uterus is unremarkable. Intrauterine device is present. Right adnexal/ovarian hypoattenuating lesion measures up to 5.3 cm. Other: No free fluid.  Abdominal wall is unremarkable. Musculoskeletal: No acute osseous abnormality. IMPRESSION: Right ovarian cystic lesion measures up to 5.3 cm. Follow-up by US is recommended in 3-6 months. Reference: JACR 2020 Feb; 17(2):248-254 Otherwise unremarkable study. Electronically Signed   By: Guadlupe SpanishPraneil  Patel M.D.    On: 09/14/2020 11:49   US Abdomen Limited  Result Date: 09/14/2020 CLINICAL DATA:  Right upper quadrant pain. EXAM: ULTRASOUND ABDOMEN LIMITED RIGHT UPPER QUADRANT COMPARISON:  01/18/2017 FINDINGS: Gallbladder: No gallstones or wall thickening visualized. No sonographic Murphy sign noted by sonographer. Common bile duct: Diameter: 4.5 mm, normal Liver: No focal lesion identified. Within normal limits in parenchymal echogenicity. Portal vein is patent on color Doppler imaging with normal direction of blood flow towards the liver. Other: None. IMPRESSION: No evidence of cholelithiasis or acute cholecystitis. Electronically Signed   By: Burman NievesWilliam  Stevens M.D.   On: 09/14/2020 03:47    EKG: Independently reviewed. NA  Assessment/Plan Principal Problem:   Elevated liver enzymes Active Problems:   Hepatitis   Hypotension   Hypokalemia   Psoriasis   30 year old female with significant past medical history for psoriasis who presents with 4 days significant gastrointestinal symptoms including nausea, vomiting, diarrhea and poor oral intake.  Positive intermittent fever, history of recent tick exposure (no attachment to rash), and eating seafood.  On her initial physical examination blood pressure 94/60-106/69, heart rate 80, respiratory rate 20, oxygen saturation 99% on room air.  She had dry mucous membranes, lungs clear to auscultation bilaterally, heart S1-S2, present, rhythmic, abdomen soft, nontender to superficial palpation, no lower extremity edema, no rashes.  Sodium 137, potassium 3.8, chloride 105, bicarb 23, glucose 112, BUN less than 5, creatinine 0.88, lipase 30, AST 336, ALT 277, white count 11.5, hemoglobin 14.7, hematocrit 42.3, platelets 199. SARS COVID-19 negative, acute hepatitis panel negative.  Urinalysis specific gravity 1.018, negative proteins negative nitrates. Abdominal CT with right ovarian cyst.  Liver and gallbladder unremarkable, pancreas unremarkable.  Mr. Fayrene FearingJames will  be admitted to the hospital with a working diagnosis of hepatitis, possible shock liver due to hypotension in the setting of hypovolemia due to dehydration.   1.  Shock liver due to hypovolemia due to increased gastrointestinal volume loss.  (Patient failed outpatient therapy).  Possible viral gastroenteritis as the triggering factor.  Doubt tickborne illness because pattern of fever, no rash and apparently insect was removed before attachment  Leukocytosis likely reactive.  Hydration with intravenous isotonic saline at 100 mill per hour.  Close monitoring of blood pressure. Continue supportive medical therapy with antiacids, analgesics (low-dose acetaminophen and morphine)  and as needed antiemetics.  Close follow-up on cell count, and temperature curve. Patient received doxycycline in the emergency department, for now hold further antibiotics.  2.  Hypokalemia.  Patient's initial potassium on first ED visit was down to 3.2, now has improved to 3.8, will give extra dose of 40 mEq p.o., follow-up kidney function in the morning. Check Mg.  Continue hydration with tonic saline.  3.  Psoriasis.  Stable no exacerbation.  Status is: Inpatient  Remains inpatient appropriate because:IV treatments appropriate due to intensity  of illness or inability to take PO   Dispo: The patient is from: Home              Anticipated d/c is to: Home              Patient currently is not medically stable to d/c.   Difficult to place patient No   DVT prophylaxis: Enoxaparin   Code Status:   full  Family Communication:  I spoke with patient's partner at the bedside, we talked in detail about patient's condition, plan of care and prognosis and all questions were addressed.     Consults called:  GI   Admission status:   inpatient    Dorlene Footman Annett Gula MD [facility]   09/14/2020, 5:25 PM

## 2020-09-14 NOTE — Consult Note (Addendum)
Consultation  Referring Provider: Dr. Dahlia Client     Primary Care Physician:  Patient, No Pcp Per (Inactive) Primary Gastroenterologist: Gentry Fitz      Reason for Consultation: Abdominal pain and vomiting with elevated LFTs              HPI:   Heather Lawrence is a 30 y.o. female with no past medical history who presented to the ED today with epigastric abdominal pain as well as nausea and vomiting.     Heather Lawrence describes that towards the end of last week around Thursday she started to feel sort of "achy", then Friday after taking a few sips of a caffeinated beverage from [employer] she started with some epigastric discomfort so she went to drinking just water and then later that day started with a lot of nausea, vomiting and diarrhea.  Tells me she had been around her sister's family also had a GI bug a few weeks back and had a few patients in the hospital with similar symptoms so "did not think much of it".  Was running a fever of 101-102.  All of these symptoms went away on Sunday morning, 09/13/2020 and then she started with severe 9-02/2009 epigastric pain that evening which made her come to the hospital.  Tells me at first she thought she may have had a "bowel obstruction".  Describes that while sitting in the ER her epigastric pain went away and ER physicians told her maybe she passed a gallstone.  She went back home but upon returning home all the pain returned so she came back.  Currently no further epigastric pain.  Tells me she is really not eaten any food since Thursday, 09/10/2020 and has had some sips of Gatorade and a little bit of water here and there.    Denies weight loss, change in bowel habits, history of IV drug use or extreme Tylenol use.  ED course: Potassium 3.2, AST 103--> 336 overnight, ALT 64--> 277 overnight, WBC 8.3>-- 11.5 overnight, lipase noprmal, hCG testing negative/normal  Past medical/surgical history: None  Family history: No liver disease  Social History    Tobacco Use  . Smoking status: Never Smoker  . Smokeless tobacco: Never Used  Vaping Use  . Vaping Use: Every day  Substance Use Topics  . Alcohol use: Not Currently  . Drug use: Never    Prior to Admission medications   Medication Sig Start Date End Date Taking? Authorizing Provider  amoxicillin (AMOXIL) 875 MG tablet Take 1 tablet (875 mg total) by mouth 2 (two) times daily. 05/18/20   Lorin Glass, MD  benzonatate (TESSALON) 100 MG capsule Take 1-2 capsules (100-200 mg total) by mouth 3 (three) times daily as needed for cough. 05/21/19   McVey, Madelaine Bhat, PA-C  ondansetron (ZOFRAN ODT) 4 MG disintegrating tablet Take 1 tablet (4 mg total) by mouth every 8 (eight) hours as needed for nausea or vomiting. 09/14/20   Roxy Horseman, PA-C  predniSONE (DELTASONE) 20 MG tablet Take 2 tablets (40 mg total) by mouth daily with breakfast. 05/18/20   Lorin Glass, MD    No current facility-administered medications for this encounter.   Current Outpatient Medications  Medication Sig Dispense Refill  . amoxicillin (AMOXIL) 875 MG tablet Take 1 tablet (875 mg total) by mouth 2 (two) times daily. 20 tablet 0  . benzonatate (TESSALON) 100 MG capsule Take 1-2 capsules (100-200 mg total) by mouth 3 (three) times daily as needed for cough. 40  capsule 0  . ondansetron (ZOFRAN ODT) 4 MG disintegrating tablet Take 1 tablet (4 mg total) by mouth every 8 (eight) hours as needed for nausea or vomiting. 10 tablet 0  . predniSONE (DELTASONE) 20 MG tablet Take 2 tablets (40 mg total) by mouth daily with breakfast. 14 tablet 0    Allergies as of 09/14/2020  . (No Known Allergies)   Review of Systems:    Constitutional: No weight loss, fever or chills Skin: No rash  Cardiovascular: No chest pain Respiratory: No SOB  Gastrointestinal: See HPI and otherwise negative Genitourinary: No dysuria Neurological: No headache, dizziness or syncope Musculoskeletal: No new muscle or joint  pain Hematologic: No bleeding  Psychiatric: No history of depression or anxiety    Physical Exam:  Vital signs in last 24 hours: Temp:  [97.6 F (36.4 C)-101.8 F (38.8 C)] 97.6 F (36.4 C) (04/25 1235) Pulse Rate:  [67-110] 67 (04/25 1245) Resp:  [15-22] 21 (04/25 1245) BP: (100-153)/(68-85) 115/68 (04/25 1215) SpO2:  [97 %-100 %] 98 % (04/25 1245) Weight:  [55.8 kg] 55.8 kg (04/25 0118)   General:   Pleasant Caucasian female appears to be in NAD, Well developed, Well nourished, alert and cooperative Head:  Normocephalic and atraumatic. Eyes:   PEERL, EOMI. No icterus. Conjunctiva pink. Ears:  Normal auditory acuity. Neck:  Supple Throat: Oral cavity and pharynx without inflammation, swelling or lesion. Teeth in good condition. Lungs: Respirations even and unlabored. Lungs clear to auscultation bilaterally.   No wheezes, crackles, or rhonchi.  Heart: Normal S1, S2. No MRG. Regular rate and rhythm. No peripheral edema, cyanosis or pallor.  Abdomen:  Soft, nondistended, nontender. No rebound or guarding. Normal bowel sounds. No appreciable masses or hepatomegaly. Rectal:  Not performed.  Msk:  Symmetrical without gross deformities. Peripheral pulses intact.  Extremities:  Without edema, no deformity or joint abnormality. Normal ROM, normal sensation. Neurologic:  Alert and  oriented x4;  grossly normal neurologically.  Skin:   Dry and intact without significant lesions or rashes. Psychiatric: Demonstrates good judgement and reason without abnormal affect or behaviors.   LAB RESULTS: Recent Labs    09/14/20 0133 09/14/20 0925  WBC 8.3 11.5*  HGB 14.5 14.7  HCT 42.7 42.3  PLT 179 199   BMET Recent Labs    09/14/20 0133 09/14/20 0925  NA 136 137  K 3.2* 3.8  CL 100 105  CO2 28 23  GLUCOSE 94 112*  BUN 5* <5*  CREATININE 0.98 0.88  CALCIUM 8.5* 8.8*   LFT Recent Labs    09/14/20 0925  PROT 6.9  ALBUMIN 3.6  AST 336*  ALT 277*  ALKPHOS 91  BILITOT 0.9     STUDIES: CT ABDOMEN PELVIS W CONTRAST  Result Date: 09/14/2020 CLINICAL DATA:  Abdominal pain EXAM: CT ABDOMEN AND PELVIS WITH CONTRAST TECHNIQUE: Multidetector CT imaging of the abdomen and pelvis was performed using the standard protocol following bolus administration of intravenous contrast. CONTRAST:  OMNIPAQUE IOHEXOL 300 MG/ML  SOLN COMPARISON:  None. FINDINGS: Lower chest: No acute abnormality. Hepatobiliary: Too small to characterize low-attenuation lesion of the inferior right hepatic lobe. Gallbladder is unremarkable. No biliary dilatation. Pancreas: Unremarkable. Spleen: Unremarkable. Adrenals/Urinary Tract: Adrenals, kidneys, and bladder are unremarkable. Stomach/Bowel: Stomach is within normal limits. Bowel is normal in caliber. Vascular/Lymphatic: No significant vascular abnormality. No enlarged lymph nodes. Reproductive: Uterus is unremarkable. Intrauterine device is present. Right adnexal/ovarian hypoattenuating lesion measures up to 5.3 cm. Other: No free fluid.  Abdominal wall is  unremarkable. Musculoskeletal: No acute osseous abnormality. IMPRESSION: Right ovarian cystic lesion measures up to 5.3 cm. Follow-up by [place] is recommended in 3-6 months. Reference: JACR 2020 Feb; 17(2):248-254 Otherwise unremarkable study. Electronically Signed   By: Guadlupe Spanish M.D.   On: 09/14/2020 11:49   US Abdomen Limited  Result Date: 09/14/2020 CLINICAL DATA:  Right upper quadrant pain. EXAM: ULTRASOUND ABDOMEN LIMITED RIGHT UPPER QUADRANT COMPARISON:  01/18/2017 FINDINGS: Gallbladder: No gallstones or wall thickening visualized. No sonographic Murphy sign noted by sonographer. Common bile duct: Diameter: 4.5 mm, normal Liver: No focal lesion identified. Within normal limits in parenchymal echogenicity. Portal vein is patent on color Doppler imaging with normal direction of blood flow towards the liver. Other: None. IMPRESSION: No evidence of cholelithiasis or acute cholecystitis.  Electronically Signed   By: Burman Nieves M.D.   On: 09/14/2020 03:47    Impression / Plan:   Impression: 1.  Epigastric pain: Initially started around 8 PM on 09/13/2020, went away when she was in the ER and came back after leaving, currently no pain now, history of nausea and vomiting with diarrhea over the past 2 to 3 days; consider gastritis versus other 2.  Elevated LFTs: Acute elevation over the past 7 to 8 hours since patient left the ER with prodrome of nausea and vomiting/diarrhea; suspicion for viral hepatitis 3.  Nausea/vomiting/diarrhea: The symptoms resolved yesterday morning  Plan: 1.  High suspicion for an acute hepatitis, acute hepatitis panel pending currently.  If this is all negative we can run further labs to consider mono etc. 2.  For now patient can be on regular diet as tolerated, continue antiemetics and analgesics as needed. 3.  We will start the patient on Pantoprazole 40 mg daily, likely given all of her recent nausea and vomiting she does have some gastritis. 4.  Continue other supportive measures 5.  Please await further recommendations from Dr. Marina Goodell later today.  Thank you for your kind consultation, we will continue to follow.  Heather Lawrence  09/14/2020, 1:25 PM   GI ATTENDING  History, laboratories, x-rays reviewed.  Patient seen and examined.  Agree with comprehensive consultation note as outlined above.  Patient presents with acute viral prodrome.  Noted to have elevated hepatic transaminases.  Was having fevers.  Suspect primary hepatitis (such as A) versus reactive hepatitis from other infectious entity.  I would recommend empirically covering her for rickettsial disease (doxycycline).  Follow-up serologies.  Care is supportive with trending of laboratories.  Discussed with patient.  Wilhemina Bonito. Eda Keys., M.D. [facility] Division of Gastroenterology

## 2020-09-14 NOTE — ED Notes (Signed)
Patient transported to Ultrasound 

## 2020-09-14 NOTE — ED Provider Notes (Signed)
[facility] EMERGENCY DEPARTMENT Provider Note   CSN: [id number] Arrival date & time: 09/14/20  0113     History Chief Complaint  Patient presents with  . Abdominal Pain  . Emesis    Heather Lawrence is a 30 y.o. female.  Patient presents to the ED with epigastric abdominal pain that started tonight at 8 PM.  She states she has had n/v/d for the past couple of days along with fever at home of 101-102.  She has been taking Tylenol, but states she hasn't taken any more than 2000mg  per day.  She states that the pain is moderate right now and feels better than earlier.  She denies vaginal discharge or bleeding.  She denies any other associated symptoms.  The history is provided by the patient. No language interpreter was used.       History reviewed. No pertinent past medical history.  There are no problems to display for this patient.   History reviewed. No pertinent surgical history.   OB History   No obstetric history on file.     History reviewed. No pertinent family history.  Social History   Tobacco Use  . Smoking status: Never Smoker  . Smokeless tobacco: Never Used  Vaping Use  . Vaping Use: Every day  Substance Use Topics  . Alcohol use: Not Currently  . Drug use: Never    Home Medications Prior to Admission medications   Medication Sig Start Date End Date Taking? Authorizing Provider  amoxicillin (AMOXIL) 875 MG tablet Take 1 tablet (875 mg total) by mouth 2 (two) times daily. 05/18/20   05/20/20, MD  benzonatate (TESSALON) 100 MG capsule Take 1-2 capsules (100-200 mg total) by mouth 3 (three) times daily as needed for cough. 05/21/19   McVey, 05/23/19, PA-C  predniSONE (DELTASONE) 20 MG tablet Take 2 tablets (40 mg total) by mouth daily with breakfast. 05/18/20   05/20/20, MD    Allergies    Patient has no known allergies.  Review of Systems   Review of Systems  All other systems reviewed and are  negative.   Physical Exam Updated Vital Signs BP (!) 153/85 (BP Location: Right Arm)   Pulse (!) 103   Temp 99.4 F (37.4 C) (Oral)   Resp 16   Ht 5' (1.524 m)   Wt 55.8 kg   SpO2 100%   BMI 24.03 kg/m   Physical Exam Vitals and nursing note reviewed.  Constitutional:      General: She is not in acute distress.    Appearance: She is well-developed.  HENT:     Head: Normocephalic and atraumatic.  Eyes:     Conjunctiva/sclera: Conjunctivae normal.  Cardiovascular:     Rate and Rhythm: Normal rate and regular rhythm.     Heart sounds: No murmur heard.   Pulmonary:     Effort: Pulmonary effort is normal. No respiratory distress.     Breath sounds: Normal breath sounds.  Abdominal:     Palpations: Abdomen is soft.     Tenderness: There is abdominal tenderness.     Comments: Epigastric abdominal tenderness  Musculoskeletal:     Cervical back: Neck supple.  Skin:    General: Skin is warm and dry.  Neurological:     Mental Status: She is alert and oriented to person, place, and time.  Psychiatric:        Mood and Affect: Mood normal.  Behavior: Behavior normal.     ED Results / Procedures / Treatments   Labs (all labs ordered are listed, but only abnormal results are displayed) Labs Reviewed  COMPREHENSIVE METABOLIC PANEL - Abnormal; Notable for the following components:      Result Value   Potassium 3.2 (*)    BUN 5 (*)    Calcium 8.5 (*)    AST 103 (*)    ALT 64 (*)    All other components within normal limits  URINALYSIS, ROUTINE W REFLEX MICROSCOPIC - Abnormal; Notable for the following components:   APPearance HAZY (*)    Hgb urine dipstick SMALL (*)    Ketones, ur 5 (*)    Bacteria, UA RARE (*)    All other components within normal limits  RESP PANEL BY RT-PCR (FLU A&B, COVID) ARPGX2  LIPASE, BLOOD  CBC  I-STAT BETA HCG BLOOD, ED ([facility], [facility], [facility] ONLY)    EKG None  Radiology No results found.  Procedures Procedures   Medications Ordered  in ED Medications  sodium chloride 0.9 % bolus 1,000 mL (has no administration in time range)  fentaNYL (SUBLIMAZE) injection 50 mcg (has no administration in time range)  ondansetron (ZOFRAN) injection 4 mg (has no administration in time range)    ED Course  I have reviewed the triage vital signs and the nursing notes.  Pertinent labs & imaging results that were available during my care of the patient were reviewed by me and considered in my medical decision making (see chart for details).    MDM Rules/Calculators/A&P                          This patient complains of epigastric abdominal pain, this involves an extensive number of treatment options, and is a complaint that carries with it a high risk of complications and morbidity.    Differential Dx Cholecystitis, pancreatitis, cholelithiasis, peptic ulcer disease, gastritis  Pertinent Labs I ordered, reviewed, and interpreted labs, which included CBC notable for no significant leukocytosis.  Mildly elevated AST and ALT.  Potassium is 3.2, but patient has had some diarrhea.  Lipase is 32.  hCG is negative.  Imaging Interpretation I ordered imaging studies which included right upper quadrant ultrasound which showed no findings suggestive of cholelithiasis or cholecystitis.  Medications I ordered medication fluids and as needed nausea and pain medication for symptom control.  Reassessments After the interventions stated above, I reevaluated the patient and found feeling significantly improved.  I reassessed the patient's abdomen, she still reports some very mild discomfort in the epigastric region, but she denies any right lower quadrant tenderness, or pain at McBurney's point.  My suspicion for appendicitis is low.  Consultants None  Plan Discharged with outpatient follow-up for repeat LFTs.  If symptoms change or worsen, she is advised to return to the emergency department.  She is agreeable with this plan.  Patient also  notes that she had some mild swelling above her left eyelid.  There is very mild swelling present, but no significant erythema, nothing suggestive of cellulitis or abscess.  Patient reports that the swelling has decreased some.  Does not appear to require further work-up tonight.  Doubt orbital cellulitis, ?allergic reaction/contact dermatitis.     Final Clinical Impression(s) / ED Diagnoses Final diagnoses:  Epigastric pain    Rx / DC Orders ED Discharge Orders         Ordered    ondansetron (ZOFRAN ODT) 4  MG disintegrating tablet  Every 8 hours PRN        09/14/20 0400           Roxy Horseman, PA-C 09/14/20 0405    Dione Booze, MD 09/14/20 [phone]

## 2020-09-14 NOTE — ED Notes (Signed)
RN attempted report x2 

## 2020-09-14 NOTE — ED Triage Notes (Signed)
Nausea, vomiting and diarrhea x 4 days and had abdominal pain starting today.   Denies any vaginal bleeding, discharge and dysuria.

## 2020-09-14 NOTE — ED Notes (Signed)
RN attempted report x1.  

## 2020-09-15 ENCOUNTER — Telehealth: Payer: Self-pay

## 2020-09-15 DIAGNOSIS — R509 Fever, unspecified: Secondary | ICD-10-CM

## 2020-09-15 DIAGNOSIS — R748 Abnormal levels of other serum enzymes: Secondary | ICD-10-CM | POA: Diagnosis not present

## 2020-09-15 DIAGNOSIS — R1010 Upper abdominal pain, unspecified: Secondary | ICD-10-CM

## 2020-09-15 DIAGNOSIS — R1013 Epigastric pain: Secondary | ICD-10-CM | POA: Diagnosis not present

## 2020-09-15 DIAGNOSIS — K759 Inflammatory liver disease, unspecified: Secondary | ICD-10-CM

## 2020-09-15 LAB — RESPIRATORY PANEL BY PCR

## 2020-09-15 LAB — COMPREHENSIVE METABOLIC PANEL
ALT: 243 U/L — ABNORMAL HIGH (ref 0–44)
AST: 181 U/L — ABNORMAL HIGH (ref 15–41)
Albumin: 2.9 g/dL — ABNORMAL LOW (ref 3.5–5.0)
Alkaline Phosphatase: 77 U/L (ref 38–126)
Anion gap: 8 (ref 5–15)
BUN: <5 mg/dL — ABNORMAL LOW (ref 6–20)
CO2: 20 mmol/L — ABNORMAL LOW (ref 22–32)
Calcium: 7.9 mg/dL — ABNORMAL LOW (ref 8.9–10.3)
Chloride: 109 mmol/L (ref 98–111)
Creatinine, Ser: 0.83 mg/dL (ref 0.44–1.00)
GFR, Estimated: >60 mL/min (ref >60)
Glucose, Bld: 88 mg/dL (ref 70–99)
Potassium: 4.1 mmol/L (ref 3.5–5.1)
Sodium: 137 mmol/L (ref 135–145)
Total Bilirubin: 0.6 mg/dL (ref 0.3–1.2)
Total Protein: 5.2 g/dL — ABNORMAL LOW (ref 6.5–8.1)

## 2020-09-15 LAB — CBC
HCT: 35.2 % — ABNORMAL LOW (ref 36.0–46.0)
Hemoglobin: 12 g/dL (ref 12.0–15.0)
MCH: 31.7 pg (ref 26.0–34.0)
MCHC: 34.1 g/dL (ref 30.0–36.0)
MCV: 92.9 fL (ref 80.0–100.0)
Platelets: 156 10*3/uL (ref 150–400)
RBC: 3.79 MIL/uL — ABNORMAL LOW (ref 3.87–5.11)
RDW: 11.9 % (ref 11.5–15.5)
WBC: 6.2 10*3/uL (ref 4.0–10.5)
nRBC: 0 % (ref 0.0–0.2)

## 2020-09-15 LAB — MAGNESIUM: Magnesium: 2 mg/dL (ref 1.7–2.4)

## 2020-09-15 MED ORDER — PROMETHAZINE HCL 25 MG/ML IJ SOLN: 6.2500 mg | Freq: Four times a day (QID) | INTRAMUSCULAR | Status: DC | PRN

## 2020-09-15 MED ORDER — SODIUM CHLORIDE 0.9 % IV SOLN
6.2500 mg | Freq: Four times a day (QID) | INTRAVENOUS | Status: DC | PRN
  Filled 2020-09-15: qty 0.25

## 2020-09-15 MED ORDER — PANTOPRAZOLE SODIUM 40 MG PO TBEC: 40.0000 mg | DELAYED_RELEASE_TABLET | Freq: Every day | ORAL | 1 refills | Status: DC

## 2020-09-15 MED ORDER — HYOSCYAMINE SULFATE 0.125 MG SL SUBL
0.1250 mg | SUBLINGUAL_TABLET | SUBLINGUAL | Status: DC | PRN
  Filled 2020-09-15: qty 1

## 2020-09-15 NOTE — Telephone Encounter (Signed)
-----   Message from Unk Lightning, [place] sent at 09/15/2020 11:52 AM EDT ----- Regarding: Follow up Can you set follow up for this patient in clinic with me in 3-4 weeks. She needs repeat hepatic panel in 1 week as well.  Thanks! JLL

## 2020-09-15 NOTE — Progress Notes (Addendum)
Progress Note   Subjective  Chief Complaint: Epigastric pain, elevated LFTs  This morning patient tells me that she is feeling fine, but the last time she tried to eat something on Thursday she developed marked epigastric pain.  She is worried that when she tries to eat again she will have the same feeling.  She has not tried to eat since admission.  Does tell me that she feels as though she can go home as long as she is eating fine.   Objective   Vital signs in last 24 hours: Temp:  [97.6 F (36.4 C)-98.8 F (37.1 C)] 98.2 F (36.8 C) (04/26 1116) Pulse Rate:  [62-90] 79 (04/26 0340) Resp:  [14-24] 18 (04/26 0340) BP: (89-121)/(53-89) 121/89 (04/26 1116) SpO2:  [96 %-100 %] 100 % (04/26 0340) Last BM Date: 09/13/20 General:    white female in NAD Heart:  Regular rate and rhythm; no murmurs Lungs: Respirations even and unlabored, lungs CTA bilaterally Abdomen:  Soft, nontender and nondistended. Normal bowel sounds. Extremities:  Without edema. Neurologic:  Alert and oriented,  grossly normal neurologically. Psych:  Cooperative. Normal mood and affect.  Intake/Output from previous day: 04/25 0701 - 04/26 0700 In: 68.3 [IV Piggyback:68.3] Out: -   Lab Results: Recent Labs    09/14/20 0133 09/14/20 0925 09/15/20 0144  WBC 8.3 11.5* 6.2  HGB 14.5 14.7 12.0  HCT 42.7 42.3 35.2*  PLT 179 199 156   BMET Recent Labs    09/14/20 0133 09/14/20 0925 09/15/20 0144  NA 136 137 137  K 3.2* 3.8 4.1  CL 100 105 109  CO2 28 23 20*  GLUCOSE 94 112* 88  BUN 5* <5* <5*  CREATININE 0.98 0.88 0.83  CALCIUM 8.5* 8.8* 7.9*   Hepatic Function Latest Ref Rng & Units 09/15/2020 09/14/2020 09/14/2020  Total Protein 6.5 - 8.1 g/dL 5.2(L) 6.9 6.5  Albumin 3.5 - 5.0 g/dL 2.9(L) 3.6 3.7  AST 15 - 41 U/L 181(H) 336(H) 103(H)  ALT 0 - 44 U/L 243(H) 277(H) 64(H)  Alk Phosphatase 38 - 126 U/L 77 91 53  Total Bilirubin 0.3 - 1.2 mg/dL 0.6 0.9 0.7   PT/INR Recent Labs     09/14/20 2019  LABPROT 15.6*  INR 1.2    Studies/Results: CT ABDOMEN PELVIS W CONTRAST  Result Date: 09/14/2020 CLINICAL DATA:  Abdominal pain EXAM: CT ABDOMEN AND PELVIS WITH CONTRAST TECHNIQUE: Multidetector CT imaging of the abdomen and pelvis was performed using the standard protocol following bolus administration of intravenous contrast. CONTRAST:  136m OMNIPAQUE IOHEXOL 300 MG/ML  SOLN COMPARISON:  None. FINDINGS: Lower chest: No acute abnormality. Hepatobiliary: Too small to characterize low-attenuation lesion of the inferior right hepatic lobe. Gallbladder is unremarkable. No biliary dilatation. Pancreas: Unremarkable. Spleen: Unremarkable. Adrenals/Urinary Tract: Adrenals, kidneys, and bladder are unremarkable. Stomach/Bowel: Stomach is within normal limits. Bowel is normal in caliber. Vascular/Lymphatic: No significant vascular abnormality. No enlarged lymph nodes. Reproductive: Uterus is unremarkable. Intrauterine device is present. Right adnexal/ovarian hypoattenuating lesion measures up to 5.3 cm. Other: No free fluid.  Abdominal wall is unremarkable. Musculoskeletal: No acute osseous abnormality. IMPRESSION: Right ovarian cystic lesion measures up to 5.3 cm. Follow-up by [facility] recommended in 3-6 months. Reference: JACR 2020 Feb; 17(2):248-254 Otherwise unremarkable study. Electronically Signed   By: PMacy MisM.D.   On: 09/14/2020 11:49   [place]domen Limited  Result Date: 09/14/2020 CLINICAL DATA:  Right upper quadrant pain. EXAM: ULTRASOUND ABDOMEN LIMITED RIGHT UPPER QUADRANT COMPARISON:  01/18/2017  FINDINGS: Gallbladder: No gallstones or wall thickening visualized. No sonographic Murphy sign noted by sonographer. Common bile duct: Diameter: 4.5 mm, normal Liver: No focal lesion identified. Within normal limits in parenchymal echogenicity. Portal vein is patent on color Doppler imaging with normal direction of blood flow towards the liver. Other: None. IMPRESSION: No evidence of  cholelithiasis or acute cholecystitis. Electronically Signed   By: Lucienne Capers M.D.   On: 09/14/2020 03:47     Assessment / Plan:   Assessment: 1.  Epigastric pain: Initially started at 8 PM on 09/13/2020, has really not tried to eat since then; consider acute gastritis versus abdominal spasm versus other 2.  Elevated LFTs: Trending down now, acute hepatitis panel negative  Plan: 1.  Acute hepatitis panel was negative.  LFTs are now trending down. 2.  Encouraged the patient to try and eat something bland today and see how this affects her abdominal pain.  I will add Levsin for her to take as needed. 3.  Continue Pantoprazole 40 mg daily for the next 1 to 2 weeks. 4.  We will arrange for the patient to have follow-up in our outpatient clinic, pending that she eats okay today and tolerates pain she can likely be discharged.  She will need a recheck of LFTs at that time.  If continuing with epigastric pain then could consider EGD in the future outpatient.  Thank you for your kind consultation.    LOS: 1 day   Levin Erp  09/15/2020, 11:49 AM   GI ATTENDING  Interval history data reviewed.  Agree with interval progress note.  Discussed with GI physician assistant.  Patient is clinically stable.  Liver tests improving.  Okay for discharge.  Has GI follow-up arranged.  Docia Chuck. Geri Seminole., M.D. [facility] Division of Gastroenterology

## 2020-09-15 NOTE — Telephone Encounter (Signed)
Spoke with patient, she has been scheduled for a 4 week hospital follow up with Hyacinth Meeker, PA-C on Tuesday, 10/20/20 at 11 AM. Patient is aware that she will need repeat lab work in a week and I will call to remind her to come in. Patient verbalized understanding of all information and had no concerns at the end of the call.  Lab order and reminder in epic.

## 2020-09-15 NOTE — Progress Notes (Signed)
Heather Lawrence to be D/C'd Home per MD order.  Discussed with the patient and all questions fully answered.  VSS, Skin clean, dry and intact without evidence of skin break down, no evidence of skin tears noted. IV catheter discontinued intact. Site without signs and symptoms of complications. Dressing and pressure applied.  An After Visit Summary was printed and given to the patient. Patient received prescription.  D/c education completed with patient/family including follow up instructions, medication list, d/c activities limitations if indicated, with other d/c instructions as indicated by MD - patient able to verbalize understanding, all questions fully answered.   Patient instructed to return to ED, call 911, or call MD for any changes in condition.   Patient escorted via WC, and D/C home via private auto.  [name] 09/15/2020 5:45 PM

## 2020-09-16 ENCOUNTER — Other Ambulatory Visit: Payer: Self-pay | Admitting: *Deleted

## 2020-09-16 ENCOUNTER — Encounter: Payer: Self-pay | Admitting: *Deleted

## 2020-09-16 LAB — ROCKY MTN SPOTTED FVR ABS PNL(IGG+IGM)
RMSF IgG: NEGATIVE
RMSF IgM: 0.84 index (ref 0.00–0.89)

## 2020-09-16 NOTE — Patient Outreach (Signed)
[facility]) Care Management  09/16/2020  Heather Lawrence 1990-06-27 [id number]   Transition of care call/case closure   Referral received:09/15/20 Initial outreach: 09/16/20 Insurance: [employer]    Subjective: Initial successful telephone call to patient's preferred number in order to complete transition of care assessment; 2 HIPAA identifiers verified. Explained purpose of call and completed transition of care assessment.   Heather Lawrence states that she is beginning to feel a little better. States she not much of a appetite, no vomiting occasional nausea. She reports  sticking to a BRAT diet as recommended by GI prior to discharge.  She discussed follow up lab appointment in the next week.    Reviewed accessing the following [place] Benefits :  She says does not have the hospital indemnity She does not  use a [facility] outpatient pharmacy.  Offered patient information on [place] find a PCP, states that she has provider to schedule  with for ongoing care.     Objective:  Heather Lawrence  was hospitalized at [facility] 4/25-4/26/22 for elevated liver enzymes, epigastric pain, Nausea vomiting diarrhea. She was discharged to home on 09/14/20 without the need for home health services or DME.   Assessment:  Patient voices good understanding of all discharge instructions.  See transition of care flowsheet for assessment details.   Plan:  Reviewed hospital discharge diagnosis of elevated liver enzymes    and discharge treatment plan using hospital discharge instructions, assessing medication adherence, reviewing problems requiring provider notification, and discussing the importance of follow up with surgeon, primary care provider and/or specialists as directed.  Reviewed [place] healthy lifestyle program information to receive discounted premium for  2023   Step 1: Get  your annual physical  Step 2: Complete your health assessment  Step 3:Identify your current  health status and complete the corresponding action step between May 23, 2020 and January 21, 2021.    No ongoing care management needs identified so will close case to [facility] Care Management services and route successful outreach letter with [facility] Care Management pamphlet and 24 Hour Nurse Line Magnet to [employer] Care Management clinical pool to be mailed to patient's home address.  Thanked patient for their services to [facility].  Egbert Garibaldi, RN, BSN  [facility] Care Management,Care Management Coordinator  [phone]- Mobile [phone]- Toll Free Main Office

## 2020-09-16 NOTE — Discharge Summary (Signed)
Physician Discharge Summary  OCTOBER PEERY YQM:[id number] DOB: 05-Jun-1990 DOA: 09/14/2020  PCP: Patient, No Pcp Per (Inactive)  Admit date: 09/14/2020 Discharge date: 09/15/2020  Admitted From: home.  Disposition:  Home  Recommendations for Outpatient Follow-up:  1. Follow up with PCP in 1-2 weeks 2. Please obtain BMP/CBC in one week Please follow up with GI  As scheduled .   Discharge Condition: stable.  CODE STATUS: full code Diet recommendation: Heart Healthy / Regular diet.   Brief/Interim Summary:  ANNSLEY AKKERMAN is a 30 y.o. female with past medical history of psoriasis, how presents with abdominal pain, diarrhea, nausea and vomiting.   Patient was at her usual state of health until 4 days ago when late in the evening she started experiencing nausea, vomiting and diarrhea.  She had multiple episodes of watery diarrhea, associated with generalized weakness, poor oral intake and intermittent fever. So far work up has been negative. Blood cultures have been ordered and negative so far.    Discharge Diagnoses:  Principal Problem:   Elevated liver enzymes Active Problems:   Hepatitis   Hypotension   Hypokalemia   Psoriasis  Shock liver due to hypovolemia due to increased gastrointestinal volume loss.  (Patient failed outpatient therapy).  Possible viral gastroenteritis as the triggering factor.  Doubt tickborne illness because pattern of fever, no rash. Liver enzymes have been improving. BP parameters have normalized.  Acute hepatitis panel is negative. Respiratory panel is negative. Mono screen is negative. Adeno virus serology ordered.  Pt reports her nausea, and diarrhea is resolved. She was able to tolerate diet without any nausea, vomiting and diarrhea. She would like to be discharged home and will follow up with PCP / gi with a repeat liver enzymes in 1 week.     2.  Hypokalemia.  replaced.   3.  Psoriasis.  Stable no exacerbation.  Discharge  Instructions  Discharge Instructions    Diet - low sodium heart healthy   Complete by: As directed    Discharge instructions   Complete by: As directed    Follow up with GI and get repeat liver enzymes in 1 week   Increase activity slowly   Complete by: As directed      Allergies as of 09/15/2020   No Known Allergies     Medication List    TAKE these medications   betamethasone dipropionate 0.05 % lotion Apply 1 application topically 2 (two) times daily as needed for itching. scalp   ondansetron 4 MG tablet Commonly known as: ZOFRAN Take 4 mg by mouth 3 (three) times daily as needed for nausea/vomiting.   pantoprazole 40 MG tablet Commonly known as: PROTONIX Take 1 tablet (40 mg total) by mouth daily.       No Known Allergies  Consultations:  Gastroenterology.    Procedures/Studies: CT ABDOMEN PELVIS W CONTRAST  Result Date: 09/14/2020 CLINICAL DATA:  Abdominal pain EXAM: CT ABDOMEN AND PELVIS WITH CONTRAST TECHNIQUE: Multidetector CT imaging of the abdomen and pelvis was performed using the standard protocol following bolus administration of intravenous contrast. CONTRAST:  OMNIPAQUE IOHEXOL 300 MG/ML  SOLN COMPARISON:  None. FINDINGS: Lower chest: No acute abnormality. Hepatobiliary: Too small to characterize low-attenuation lesion of the inferior right hepatic lobe. Gallbladder is unremarkable. No biliary dilatation. Pancreas: Unremarkable. Spleen: Unremarkable. Adrenals/Urinary Tract: Adrenals, kidneys, and bladder are unremarkable. Stomach/Bowel: Stomach is within normal limits. Bowel is normal in caliber. Vascular/Lymphatic: No significant vascular abnormality. No enlarged lymph nodes. Reproductive: Uterus is unremarkable. Intrauterine  device is present. Right adnexal/ovarian hypoattenuating lesion measures up to 5.3 cm. Other: No free fluid.  Abdominal wall is unremarkable. Musculoskeletal: No acute osseous abnormality. IMPRESSION: Right ovarian cystic lesion  measures up to 5.3 cm. Follow-up by [place] is recommended in 3-6 months. Reference: JACR 2020 Feb; 17(2):248-254 Otherwise unremarkable study. Electronically Signed   By: Guadlupe Spanish M.D.   On: 09/14/2020 11:49   US Abdomen Limited  Result Date: 09/14/2020 CLINICAL DATA:  Right upper quadrant pain. EXAM: ULTRASOUND ABDOMEN LIMITED RIGHT UPPER QUADRANT COMPARISON:  01/18/2017 FINDINGS: Gallbladder: No gallstones or wall thickening visualized. No sonographic Murphy sign noted by sonographer. Common bile duct: Diameter: 4.5 mm, normal Liver: No focal lesion identified. Within normal limits in parenchymal echogenicity. Portal vein is patent on color Doppler imaging with normal direction of blood flow towards the liver. Other: None. IMPRESSION: No evidence of cholelithiasis or acute cholecystitis. Electronically Signed   By: Burman Nieves M.D.   On: 09/14/2020 03:47       Subjective: No new complaints.   Discharge Exam: Vitals:   09/15/20 0340 09/15/20 1116  BP: (!) 89/53 121/89  Pulse: 79   Resp: 18   Temp: 98.1 F (36.7 C) 98.2 F (36.8 C)  SpO2: 100%    Vitals:   09/14/20 1925 09/14/20 2335 09/15/20 0340 09/15/20 1116  BP: 109/84 96/64 (!) 89/53 121/89  Pulse: 84 80 79   Resp: 18 18 18    Temp:  98.8 F (37.1 C) 98.1 F (36.7 C) 98.2 F (36.8 C)  TempSrc:  Oral Oral Oral  SpO2: 98% 98% 100%     General: Pt is alert, awake, not in acute distress Cardiovascular: RRR, S1/S2 +, no rubs, no gallops Respiratory: CTA bilaterally, no wheezing, no rhonchi Abdominal: Soft, NT, ND, bowel sounds + Extremities: no edema, no cyanosis    The results of significant diagnostics from this hospitalization (including imaging, microbiology, ancillary and laboratory) are listed below for reference.     Microbiology: Recent Results (from the past 240 hour(s))  Resp Panel by RT-PCR (Flu A&B, Covid) Nasopharyngeal Swab     Status: None   Collection Time: 09/14/20  3:03 AM   Specimen:  Nasopharyngeal Swab; Nasopharyngeal(NP) swabs in vial transport medium  Result Value Ref Range Status   SARS Coronavirus 2 by RT PCR NEGATIVE NEGATIVE Final    Comment: (NOTE) SARS-CoV-2 target nucleic acids are NOT DETECTED.  The SARS-CoV-2 RNA is generally detectable in upper respiratory specimens during the acute phase of infection. The lowest concentration of SARS-CoV-2 viral copies this assay can detect is 138 copies/mL. A negative result does not preclude SARS-Cov-2 infection and should not be used as the sole basis for treatment or other patient management decisions. A negative result may occur with  improper specimen collection/handling, submission of specimen other than nasopharyngeal swab, presence of viral mutation(s) within the areas targeted by this assay, and inadequate number of viral copies(<138 copies/mL). A negative result must be combined with clinical observations, patient history, and epidemiological information. The expected result is Negative.  Fact Sheet for Patients:  09/16/20  Fact Sheet for Healthcare Providers:  BloggerCourse.com  This test is no t yet approved or cleared by the [place] FDA and  has been authorized for detection and/or diagnosis of SARS-CoV-2 by FDA under an Emergency Use Authorization (EUA). This EUA will remain  in effect (meaning this test can be used) for the duration of the COVID-19 declaration under Section 564(b)(1) of the Act, 21 U.S.C.section 360bbb-3(b)(1), unless  the authorization is terminated  or revoked sooner.       Influenza A by PCR NEGATIVE NEGATIVE Final   Influenza B by PCR NEGATIVE NEGATIVE Final    Comment: (NOTE) The Xpert Xpress SARS-CoV-2/FLU/RSV plus assay is intended as an aid in the diagnosis of influenza from Nasopharyngeal swab specimens and should not be used as a sole basis for treatment. Nasal washings and aspirates are unacceptable for  Xpert Xpress SARS-CoV-2/FLU/RSV testing.  Fact Sheet for Patients: BloggerCourse.com  Fact Sheet for Healthcare Providers: SeriousBroker.it  This test is not yet approved or cleared by the [place] FDA and has been authorized for detection and/or diagnosis of SARS-CoV-2 by FDA under an Emergency Use Authorization (EUA). This EUA will remain in effect (meaning this test can be used) for the duration of the COVID-19 declaration under Section 564(b)(1) of the Act, 21 U.S.C. section 360bbb-3(b)(1), unless the authorization is terminated or revoked.  Performed at [facility], [address], [place], [place] [address]   Culture, blood (single)     Status: None (Preliminary result)   Collection Time: 09/15/20  1:10 PM   Specimen: BLOOD LEFT HAND  Result Value Ref Range Status   Specimen Description BLOOD LEFT HAND  Final   Special Requests   Final    BOTTLES DRAWN AEROBIC AND ANAEROBIC Blood Culture adequate volume   Culture   Final    NO GROWTH < 24 HOURS Performed at [facility], [address] [place], [place] [address]    Report Status PENDING  Incomplete  Respiratory (~20 pathogens) panel by PCR     Status: None   Collection Time: 09/15/20  3:02 PM   Specimen: Nasopharyngeal Swab; Respiratory  Result Value Ref Range Status   Adenovirus NOT DETECTED NOT DETECTED Final   Coronavirus 229E NOT DETECTED NOT DETECTED Final    Comment: (NOTE) The Coronavirus on the Respiratory Panel, DOES NOT test for the novel  Coronavirus (2019 nCoV)    Coronavirus HKU1 NOT DETECTED NOT DETECTED Final   Coronavirus NL63 NOT DETECTED NOT DETECTED Final   Coronavirus OC43 NOT DETECTED NOT DETECTED Final   Metapneumovirus NOT DETECTED NOT DETECTED Final   Rhinovirus / Enterovirus NOT DETECTED NOT DETECTED Final   Influenza A NOT DETECTED NOT DETECTED Final   Influenza B NOT DETECTED NOT DETECTED Final   Parainfluenza Virus 1  NOT DETECTED NOT DETECTED Final   Parainfluenza Virus 2 NOT DETECTED NOT DETECTED Final   Parainfluenza Virus 3 NOT DETECTED NOT DETECTED Final   Parainfluenza Virus 4 NOT DETECTED NOT DETECTED Final   Respiratory Syncytial Virus NOT DETECTED NOT DETECTED Final   Bordetella pertussis NOT DETECTED NOT DETECTED Final   Bordetella Parapertussis NOT DETECTED NOT DETECTED Final   Chlamydophila pneumoniae NOT DETECTED NOT DETECTED Final   Mycoplasma pneumoniae NOT DETECTED NOT DETECTED Final    Comment: Performed at [facility], [address] [place], [place] [address]     Labs: BNP (last 3 results) No results for input(s): BNP in the last 8760 hours. Basic Metabolic Panel: Recent Labs  Lab 09/14/20 0133 09/14/20 0925 09/15/20 0144  NA 136 137 137  K 3.2* 3.8 4.1  CL 100 105 109  CO2 28 23 20*  GLUCOSE 94 112* 88  BUN 5* <5* <5*  CREATININE 0.98 0.88 0.83  CALCIUM 8.5* 8.8* 7.9*  MG  --   --  2.0   Liver Function Tests: Recent Labs  Lab 09/14/20 0133  09/14/20 0925 09/15/20 0144  AST 103* 336* 181*  ALT 64* 277* 243*  ALKPHOS 53 91 77  BILITOT 0.7 0.9 0.6  PROT 6.5 6.9 5.2*  ALBUMIN 3.7 3.6 2.9*   Recent Labs  Lab 09/14/20 0133 09/14/20 0925  LIPASE 32 30   No results for input(s): AMMONIA in the last 168 hours. CBC: Recent Labs  Lab 09/14/20 0133 09/14/20 0925 09/15/20 0144  WBC 8.3 11.5* 6.2  NEUTROABS  --  9.3*  --   HGB 14.5 14.7 12.0  HCT 42.7 42.3 35.2*  MCV 93.2 92.2 92.9  PLT 179 199 156   Cardiac Enzymes: No results for input(s): CKTOTAL, CKMB, CKMBINDEX, TROPONINI in the last 168 hours. BNP: Invalid input(s): POCBNP CBG: No results for input(s): GLUCAP in the last 168 hours. D-Dimer No results for input(s): DDIMER in the last 72 hours. Hgb A1c No results for input(s): HGBA1C in the last 72 hours. Lipid Profile No results for input(s): CHOL, HDL, LDLCALC, TRIG, CHOLHDL, LDLDIRECT in the last 72 hours. Thyroid function studies No  results for input(s): TSH, T4TOTAL, T3FREE, THYROIDAB in the last 72 hours.  Invalid input(s): FREET3 Anemia work up No results for input(s): VITAMINB12, FOLATE, FERRITIN, TIBC, IRON, RETICCTPCT in the last 72 hours. Urinalysis    Component Value Date/Time   COLORURINE STRAW (A) 09/14/2020 1105   APPEARANCEUR CLEAR 09/14/2020 1105   LABSPEC 1.003 (L) 09/14/2020 1105   PHURINE 7.0 09/14/2020 1105   GLUCOSEU NEGATIVE 09/14/2020 1105   HGBUR SMALL (A) 09/14/2020 1105   BILIRUBINUR NEGATIVE 09/14/2020 1105   KETONESUR NEGATIVE 09/14/2020 1105   PROTEINUR NEGATIVE 09/14/2020 1105   NITRITE NEGATIVE 09/14/2020 1105   LEUKOCYTESUR NEGATIVE 09/14/2020 1105   Sepsis Labs Invalid input(s): PROCALCITONIN,  WBC,  LACTICIDVEN Microbiology Recent Results (from the past 240 hour(s))  Resp Panel by RT-PCR (Flu A&B, Covid) Nasopharyngeal Swab     Status: None   Collection Time: 09/14/20  3:03 AM   Specimen: Nasopharyngeal Swab; Nasopharyngeal(NP) swabs in vial transport medium  Result Value Ref Range Status   SARS Coronavirus 2 by RT PCR NEGATIVE NEGATIVE Final    Comment: (NOTE) SARS-CoV-2 target nucleic acids are NOT DETECTED.  The SARS-CoV-2 RNA is generally detectable in upper respiratory specimens during the acute phase of infection. The lowest concentration of SARS-CoV-2 viral copies this assay can detect is 138 copies/mL. A negative result does not preclude SARS-Cov-2 infection and should not be used as the sole basis for treatment or other patient management decisions. A negative result may occur with  improper specimen collection/handling, submission of specimen other than nasopharyngeal swab, presence of viral mutation(s) within the areas targeted by this assay, and inadequate number of viral copies(<138 copies/mL). A negative result must be combined with clinical observations, patient history, and epidemiological information. The expected result is Negative.  Fact Sheet for  Patients:  BloggerCourse.comhttps://www.fda.gov/media/152166/download  Fact Sheet for Healthcare Providers:  SeriousBroker.ithttps://www.fda.gov/media/152162/download  This test is no t yet approved or cleared by the [place] FDA and  has been authorized for detection and/or diagnosis of SARS-CoV-2 by FDA under an Emergency Use Authorization (EUA). This EUA will remain  in effect (meaning this test can be used) for the duration of the COVID-19 declaration under Section 564(b)(1) of the Act, 21 U.S.C.section 360bbb-3(b)(1), unless the authorization is terminated  or revoked sooner.       Influenza A by PCR NEGATIVE NEGATIVE Final   Influenza B by PCR NEGATIVE NEGATIVE Final    Comment: (NOTE) The  Xpert Xpress SARS-CoV-2/FLU/RSV plus assay is intended as an aid in the diagnosis of influenza from Nasopharyngeal swab specimens and should not be used as a sole basis for treatment. Nasal washings and aspirates are unacceptable for Xpert Xpress SARS-CoV-2/FLU/RSV testing.  Fact Sheet for Patients: BloggerCourse.com  Fact Sheet for Healthcare Providers: SeriousBroker.it  This test is not yet approved or cleared by the [place] FDA and has been authorized for detection and/or diagnosis of SARS-CoV-2 by FDA under an Emergency Use Authorization (EUA). This EUA will remain in effect (meaning this test can be used) for the duration of the COVID-19 declaration under Section 564(b)(1) of the Act, 21 U.S.C. section 360bbb-3(b)(1), unless the authorization is terminated or revoked.  Performed at [facility], [address] [place], [place] [address]   Culture, blood (single)     Status: None (Preliminary result)   Collection Time: 09/15/20  1:10 PM   Specimen: BLOOD LEFT HAND  Result Value Ref Range Status   Specimen Description BLOOD LEFT HAND  Final   Special Requests   Final    BOTTLES DRAWN AEROBIC AND ANAEROBIC Blood Culture adequate volume    Culture   Final    NO GROWTH < 24 HOURS Performed at [facility], [address], [place], [place] [address]    Report Status PENDING  Incomplete  Respiratory (~20 pathogens) panel by PCR     Status: None   Collection Time: 09/15/20  3:02 PM   Specimen: Nasopharyngeal Swab; Respiratory  Result Value Ref Range Status   Adenovirus NOT DETECTED NOT DETECTED Final   Coronavirus 229E NOT DETECTED NOT DETECTED Final    Comment: (NOTE) The Coronavirus on the Respiratory Panel, DOES NOT test for the novel  Coronavirus (2019 nCoV)    Coronavirus HKU1 NOT DETECTED NOT DETECTED Final   Coronavirus NL63 NOT DETECTED NOT DETECTED Final   Coronavirus OC43 NOT DETECTED NOT DETECTED Final   Metapneumovirus NOT DETECTED NOT DETECTED Final   Rhinovirus / Enterovirus NOT DETECTED NOT DETECTED Final   Influenza A NOT DETECTED NOT DETECTED Final   Influenza B NOT DETECTED NOT DETECTED Final   Parainfluenza Virus 1 NOT DETECTED NOT DETECTED Final   Parainfluenza Virus 2 NOT DETECTED NOT DETECTED Final   Parainfluenza Virus 3 NOT DETECTED NOT DETECTED Final   Parainfluenza Virus 4 NOT DETECTED NOT DETECTED Final   Respiratory Syncytial Virus NOT DETECTED NOT DETECTED Final   Bordetella pertussis NOT DETECTED NOT DETECTED Final   Bordetella Parapertussis NOT DETECTED NOT DETECTED Final   Chlamydophila pneumoniae NOT DETECTED NOT DETECTED Final   Mycoplasma pneumoniae NOT DETECTED NOT DETECTED Final    Comment: Performed at [facility], [address] [place], [place] [address]     Time coordinating discharge: 32 minutes  SIGNED:   Kathlen Mody, MD  [facility]

## 2020-09-18 LAB — MISC LABCORP TEST (SEND OUT): Labcorp test code: 815931

## 2020-09-20 LAB — CULTURE, BLOOD (SINGLE)
Culture: NO GROWTH
Special Requests: ADEQUATE

## 2020-09-22 ENCOUNTER — Other Ambulatory Visit: Payer: 59

## 2020-09-22 ENCOUNTER — Telehealth: Payer: Self-pay

## 2020-09-22 DIAGNOSIS — K759 Inflammatory liver disease, unspecified: Secondary | ICD-10-CM

## 2020-09-22 DIAGNOSIS — R748 Abnormal levels of other serum enzymes: Secondary | ICD-10-CM

## 2020-09-22 LAB — HEPATIC FUNCTION PANEL
ALT: 66 U/L — ABNORMAL HIGH (ref 0–35)
AST: 32 U/L (ref 0–37)
Albumin: 4.3 g/dL (ref 3.5–5.2)
Alkaline Phosphatase: 52 U/L (ref 39–117)
Bilirubin, Direct: 0.1 mg/dL (ref 0.0–0.3)
Total Bilirubin: 0.4 mg/dL (ref 0.2–1.2)
Total Protein: 7.2 g/dL (ref 6.0–8.3)

## 2020-09-22 NOTE — Telephone Encounter (Signed)
-----   Message from Missy Sabins, RN sent at 09/15/2020 11:56 AM EDT ----- Regarding: Labs Repeat hepatic function panel. Order in epic.

## 2020-09-22 NOTE — Telephone Encounter (Signed)
Spoke with patient and advised that we have received her results but Victorino Dike has not had a chance to review them yet. Advised that she will be back in the office tomorrow and I will give her a call with her recommendations. Patient verbalized understanding and had no concerns at the end of the call.

## 2020-09-22 NOTE — Telephone Encounter (Signed)
Patient calling to get lab results.

## 2020-09-22 NOTE — Telephone Encounter (Signed)
Spoke with patient to remind her that she is due for repeat labs at this time. No appointment is necessary. Patient is aware that she can stop by the lab in the basement at her convenience between 7:30 AM - 5 PM, Monday through Friday. Patient verbalized understanding and had no concerns at the end of the call.   

## 2020-09-23 ENCOUNTER — Other Ambulatory Visit: Payer: Self-pay

## 2020-09-23 DIAGNOSIS — K759 Inflammatory liver disease, unspecified: Secondary | ICD-10-CM

## 2020-09-23 DIAGNOSIS — R748 Abnormal levels of other serum enzymes: Secondary | ICD-10-CM

## 2020-09-23 NOTE — Telephone Encounter (Signed)
Spoke with patient, see 09/22/20 result note for more information.

## 2020-10-14 ENCOUNTER — Telehealth: Payer: 59 | Admitting: Physician Assistant

## 2020-10-14 DIAGNOSIS — J019 Acute sinusitis, unspecified: Secondary | ICD-10-CM

## 2020-10-14 DIAGNOSIS — B9689 Other specified bacterial agents as the cause of diseases classified elsewhere: Secondary | ICD-10-CM

## 2020-10-14 MED ORDER — DOXYCYCLINE HYCLATE 100 MG PO CAPS: 100.0000 mg | ORAL_CAPSULE | Freq: Two times a day (BID) | ORAL | 0 refills | Status: DC

## 2020-10-14 NOTE — Progress Notes (Signed)

## 2020-10-14 NOTE — Progress Notes (Signed)
I have spent 5 minutes in review of e-visit questionnaire, review and updating patient chart, medical decision making and response to patient.   Antrell Tipler Cody Eliyana Pagliaro, PA-C    

## 2020-10-20 ENCOUNTER — Telehealth: Payer: Self-pay

## 2020-10-20 ENCOUNTER — Ambulatory Visit: Payer: 59 | Admitting: Physician Assistant

## 2020-10-20 NOTE — Telephone Encounter (Signed)
Called patient, unable to leave a voicemail.  My Chart message sent to patient with lab reminder.

## 2020-10-20 NOTE — Telephone Encounter (Signed)
-----   Message from Missy Sabins, RN sent at 09/23/2020  9:33 AM EDT ----- Regarding: Labs Repeat hepatic function panel. Order in epic. Patient has an appt on 10/20/20.

## 2020-10-28 ENCOUNTER — Other Ambulatory Visit: Payer: 59

## 2020-10-28 DIAGNOSIS — R748 Abnormal levels of other serum enzymes: Secondary | ICD-10-CM

## 2020-10-28 DIAGNOSIS — K759 Inflammatory liver disease, unspecified: Secondary | ICD-10-CM

## 2020-10-28 LAB — HEPATIC FUNCTION PANEL
ALT: 12 U/L (ref 0–35)
AST: 17 U/L (ref 0–37)
Albumin: 4.2 g/dL (ref 3.5–5.2)
Alkaline Phosphatase: 47 U/L (ref 39–117)
Bilirubin, Direct: 0.1 mg/dL (ref 0.0–0.3)
Total Bilirubin: 0.7 mg/dL (ref 0.2–1.2)
Total Protein: 7.4 g/dL (ref 6.0–8.3)

## 2020-11-17 ENCOUNTER — Ambulatory Visit: Payer: 59 | Admitting: Gastroenterology

## 2020-12-15 NOTE — Progress Notes (Deleted)
12/15/2020 Heather Lawrence [id number] 1990-11-12   CHIEF COMPLAINT:   HISTORY OF PRESENT ILLNESS:  Heather Lawrence is a 30 year old female with a past medical history of   She presents to our office today for follow up regarding her hospital admission 08/2020 due to having N/V, epigastric pain and elevated LFTs.   RUQ sono 09/14/2020: No focal lesion identified. Within normal limits in parenchymal echogenicity. Portal vein is patent on color Doppler imaging with normal direction of blood flow towards the liver. No evidence of cholelithiasis or acute cholecystitis.  CTAP with contrast 09/13/2020: Hepatobiliary: Too small to characterize low-attenuation lesion of the inferior right hepatic lobe. Gallbladder is unremarkable. No biliary dilatation.  Pancreas: Unremarkable.  Spleen: Unremarkable Right ovarian cystic lesion measures up to 5.3 cm. Follow-up by [place] is recommended in 3-6 months  Hepatic Function Latest Ref Rng & Units 10/28/2020 09/22/2020 09/15/2020  Total Protein 6.0 - 8.3 g/dL 7.4 7.2 5.2(L)  Albumin 3.5 - 5.2 g/dL 4.2 4.3 2.9(L)  AST 0 - 37 U/L 17 32 181(H)  ALT 0 - 35 U/L 12 66(H) 243(H)  Alk Phosphatase 39 - 117 U/L 47 52 77  Total Bilirubin 0.2 - 1.2 mg/dL 0.7 0.4 0.6  Bilirubin, Direct 0.0 - 0.3 mg/dL 0.1 0.1 -       Past Medical History:  Diagnosis Date   Elevated LFTs    Hepatitis    Hypokalemia    Psoriasis    No past surgical history on file.  Social History:  Family History:    reports that she has never smoked. She has never used smokeless tobacco. She reports previous alcohol use. She reports that she does not use drugs. family history is not on file. No Known Allergies    Outpatient Encounter Medications as of 12/16/2020  Medication Sig   betamethasone dipropionate 0.05 % lotion Apply 1 application topically 2 (two) times daily as needed for itching. scalp   doxycycline (VIBRAMYCIN) 100 MG capsule Take 1 capsule (100 mg total) by mouth 2  (two) times daily.   ondansetron (ZOFRAN) 4 MG tablet Take 4 mg by mouth 3 (three) times daily as needed for nausea/vomiting.   pantoprazole (PROTONIX) 40 MG tablet Take 1 tablet (40 mg total) by mouth daily.   No facility-administered encounter medications on file as of 12/16/2020.     REVIEW OF SYSTEMS: All other systems reviewed and negative except where noted in the History of Present Illness.  Gen: Denies fever, sweats or chills. No weight loss.  CV: Denies chest pain, palpitations or edema. Resp: Denies cough, shortness of breath of hemoptysis.  GI: Denies heartburn, dysphagia, stomach or lower abdominal pain. No diarrhea or constipation.  GU : Denies urinary burning, blood in urine, increased urinary frequency or incontinence. MS: Denies joint pain, muscles aches or weakness. Derm: Denies rash, itchiness, skin lesions or unhealing ulcers. Psych: Denies depression, anxiety, memory loss, suicidal ideation and confusion. Heme: Denies bruising, bleeding. Neuro:  Denies headaches, dizziness or paresthesias. Endo:  Denies any problems with DM, thyroid or adrenal function.   PHYSICAL EXAM: There were no vitals taken for this visit. General: Well developed ... in no acute distress. Head: Normocephalic and atraumatic. Eyes:  Sclerae non-icteric, conjunctive pink. Ears: Normal auditory acuity. Mouth: Dentition intact. No ulcers or lesions.  Neck: Supple, no lymphadenopathy or thyromegaly.  Lungs: Clear bilaterally to auscultation without wheezes, crackles or rhonchi. Heart: Regular rate and rhythm. No murmur, rub or gallop appreciated.  Abdomen:  Soft, nontender, non distended. No masses. No hepatosplenomegaly. Normoactive bowel sounds x 4 quadrants.  Rectal:  Musculoskeletal: Symmetrical with no gross deformities. Skin: Warm and dry. No rash or lesions on visible extremities. Extremities: No edema. Neurological: Alert oriented x 4, no focal deficits.  Psychological:  Alert and  cooperative. Normal mood and affect.  ASSESSMENT AND PLAN:    CC:  No ref. provider found

## 2020-12-16 ENCOUNTER — Ambulatory Visit: Payer: 59 | Admitting: Nurse Practitioner

## 2020-12-16 ENCOUNTER — Telehealth: Payer: Self-pay

## 2020-12-16 NOTE — Telephone Encounter (Signed)
No show letter mailed to patient to call us and r/s her appointment.

## 2021-04-16 ENCOUNTER — Encounter (HOSPITAL_COMMUNITY): Payer: Self-pay | Admitting: Radiology

## 2021-05-03 DIAGNOSIS — N83201 Unspecified ovarian cyst, right side: Secondary | ICD-10-CM | POA: Diagnosis not present

## 2021-05-30 IMAGING — CT CT ABD-PELV W/ CM
2 of 4 series · 16 of 46 positions shown, 18 images · IV contrast (APPLIED)
Comparison: None.

CLINICAL DATA: Abdominal pain

EXAM:
CT ABDOMEN AND PELVIS WITH CONTRAST
TECHNIQUE: Multidetector CT imaging of the abdomen and pelvis was performed
using the standard protocol following bolus administration of
intravenous contrast.
CONTRAST:  100mL OMNIPAQUE IOHEXOL 300 MG/ML  SOLN

[Series 3: abdomen 5.0 · axial · 0.63mm/px · z∈[-414,-44]mm · 13 of 84 slices shown, 15 images]
[im 5/84  soft-tissue]
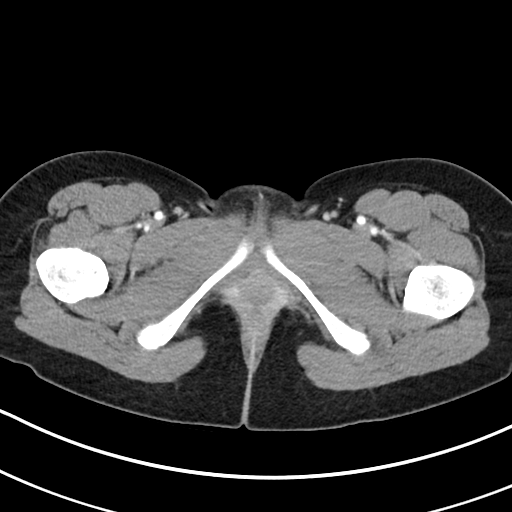
[im 5/84  bone]
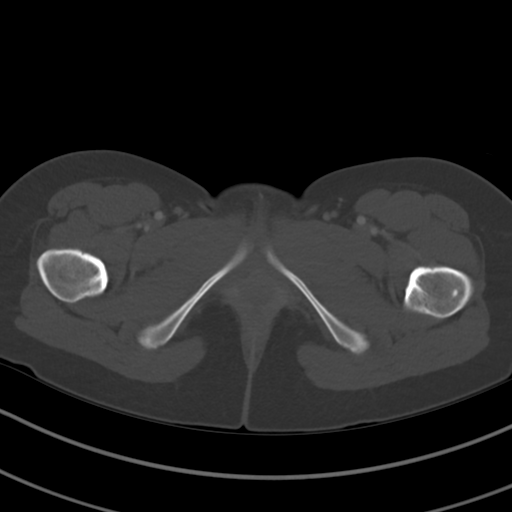
[im 10/84  soft-tissue]
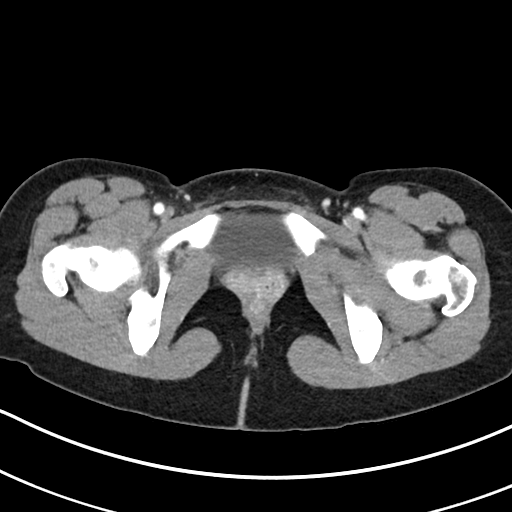
[im 19/84  soft-tissue]
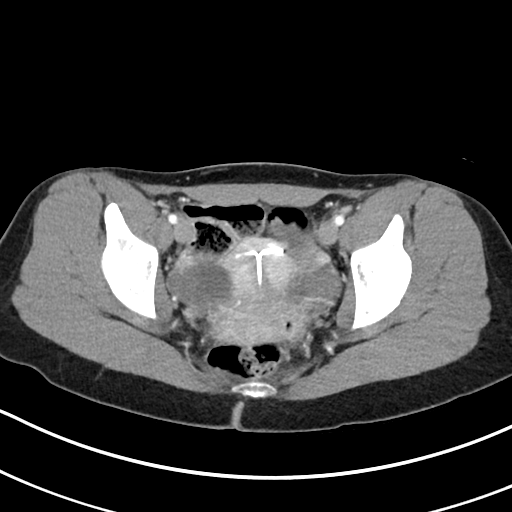
[im 24/84  soft-tissue]
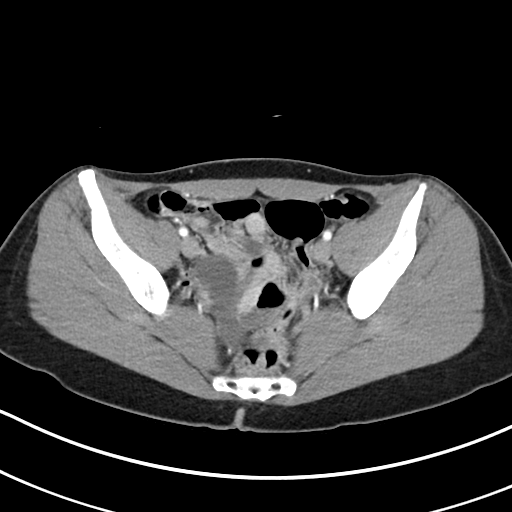
[im 28/84  soft-tissue]
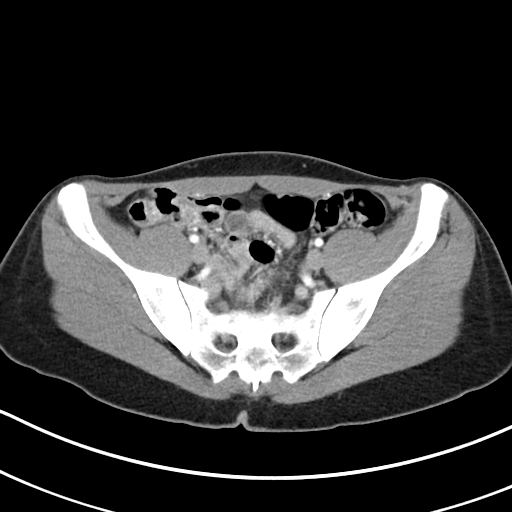
[im 37/84  soft-tissue]
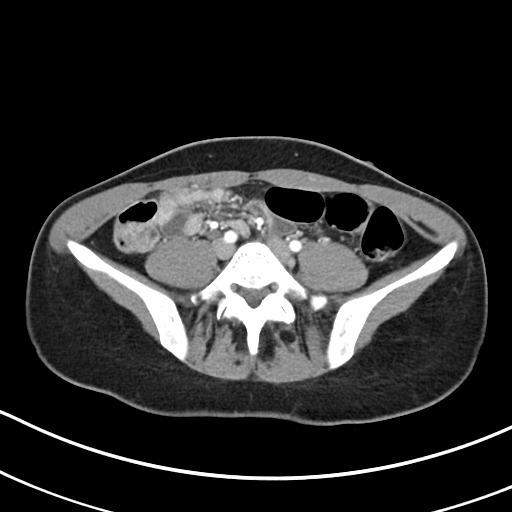
[im 42/84  soft-tissue]
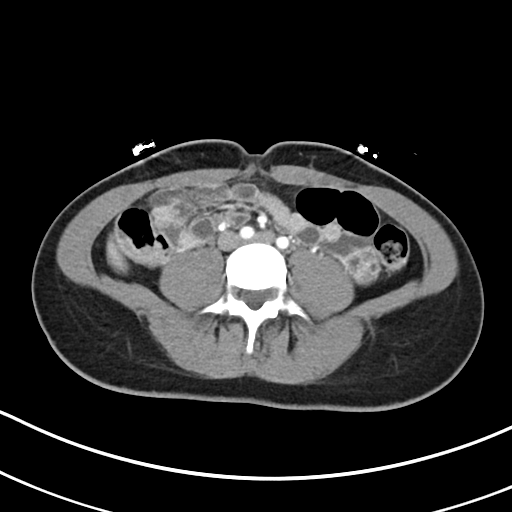
[im 47/84  soft-tissue]
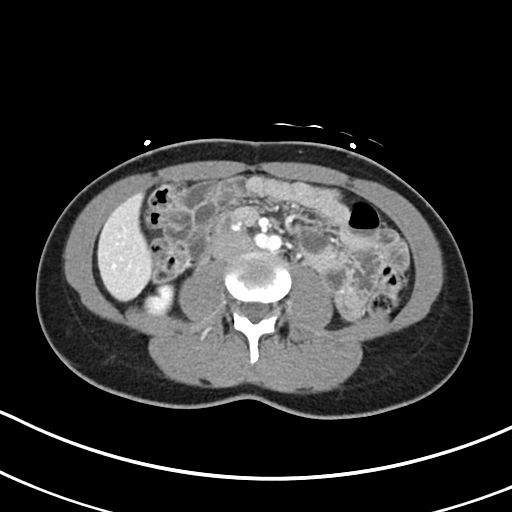
[im 56/84  soft-tissue]
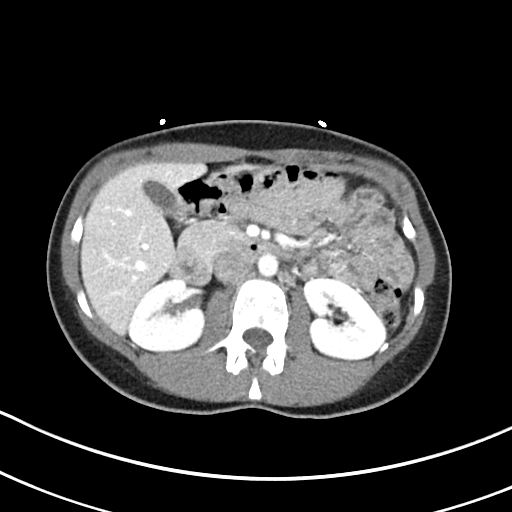
[im 56/84  bone]
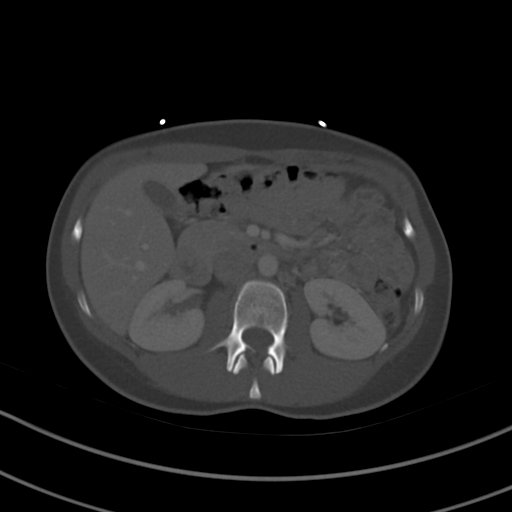
[im 60/84  soft-tissue]
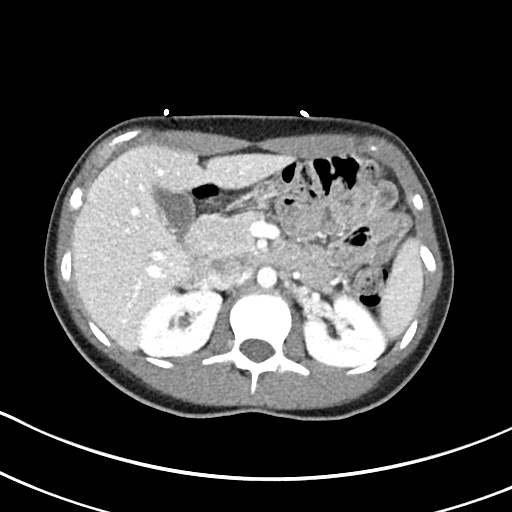
[im 65/84  soft-tissue]
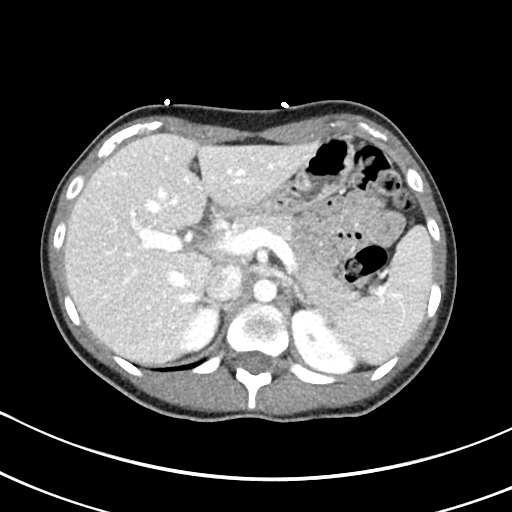
[im 74/84  soft-tissue]
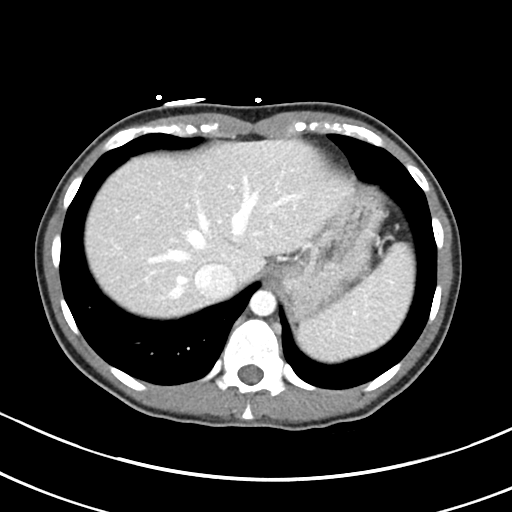
[im 79/84  soft-tissue]
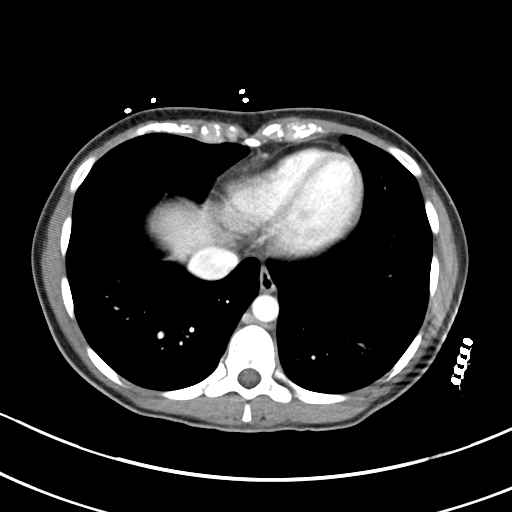

[Series 6: abdomen 3.0 mpr cor · coronal · 0.61mm/px · 3 of 72 slices shown]
[im 24/72  soft-tissue]
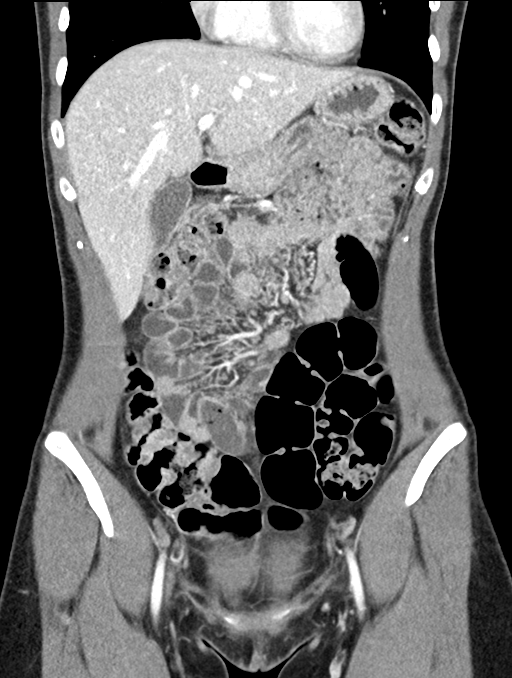
[im 32/72  soft-tissue]
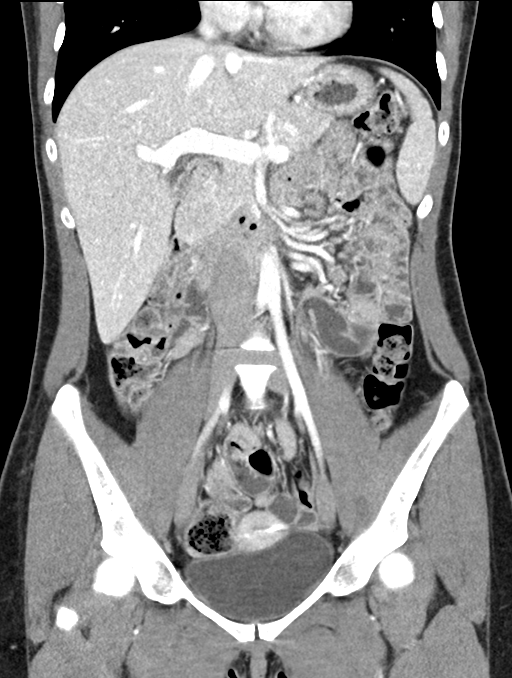
[im 40/72  soft-tissue]
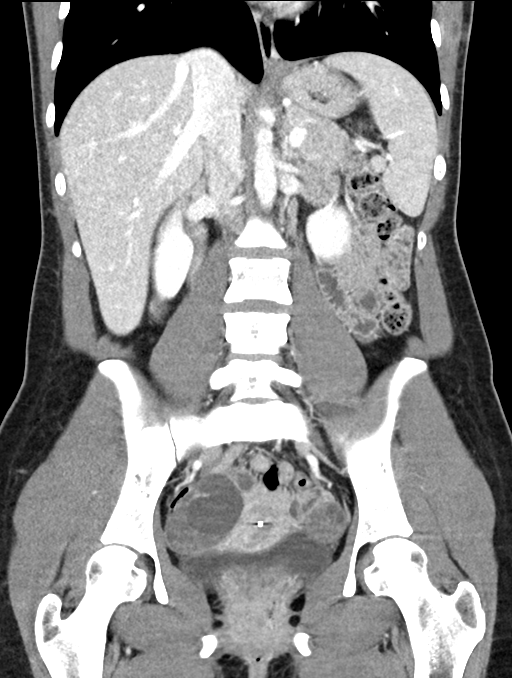

[16 of 46 positions shown; findings below may reference images not displayed]

FINDINGS: Lower chest: No acute abnormality.

Hepatobiliary: Too small to characterize low-attenuation lesion of
the inferior right hepatic lobe. Gallbladder is unremarkable. No
biliary dilatation.

Pancreas: Unremarkable.

Spleen: Unremarkable.

Adrenals/Urinary Tract: Adrenals, kidneys, and bladder are
unremarkable.

Stomach/Bowel: Stomach is within normal limits. Bowel is normal in
caliber.

Vascular/Lymphatic: No significant vascular abnormality. No enlarged
lymph nodes.

Reproductive: Uterus is unremarkable. Intrauterine device is
present. Right adnexal/ovarian hypoattenuating lesion measures up to
5.3 cm.

Other: No free fluid.  Abdominal wall is unremarkable.

Musculoskeletal: No acute osseous abnormality.
IMPRESSION: Right ovarian cystic lesion measures up to 5.3 cm. Follow-up by US
is recommended in 3-6 months. Reference: JACR [DATE];

Otherwise unremarkable study.

## 2021-05-30 IMAGING — US US ABDOMEN LIMITED RUQ/ASCITES
1 series · 14 of 25 positions shown · non-contrast
Comparison: 01/18/2017

CLINICAL DATA: Right upper quadrant pain.

EXAM:
ULTRASOUND ABDOMEN LIMITED RIGHT UPPER QUADRANT

[Series 1: us abdomen limited · 14 of 73 slices shown]
[im 1/73]
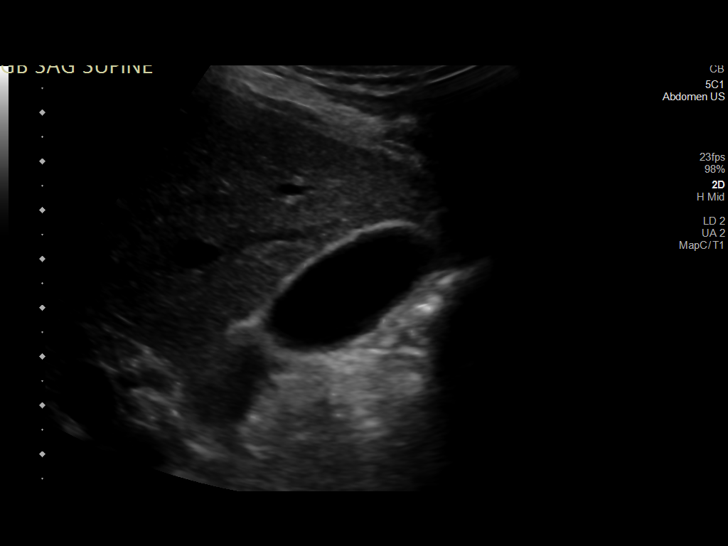
[im 7/73]
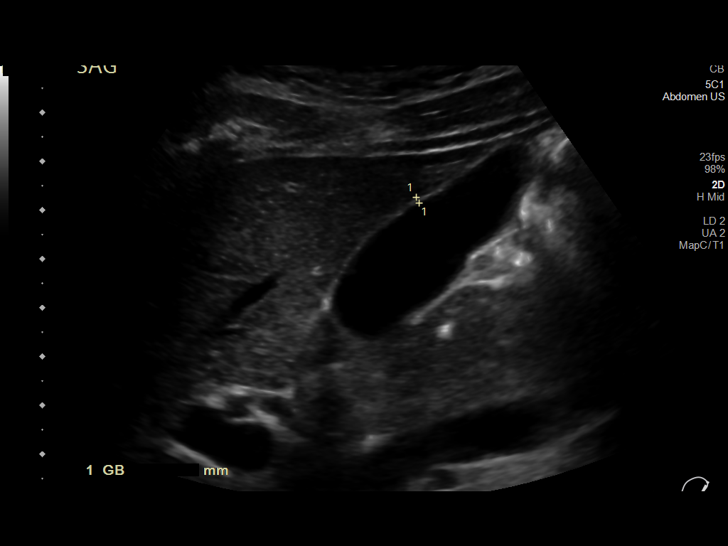
[im 13/73]
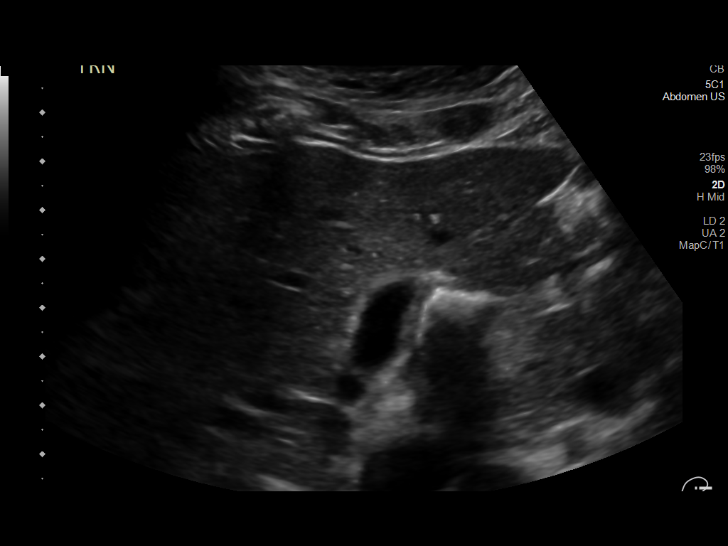
[im 19/73]
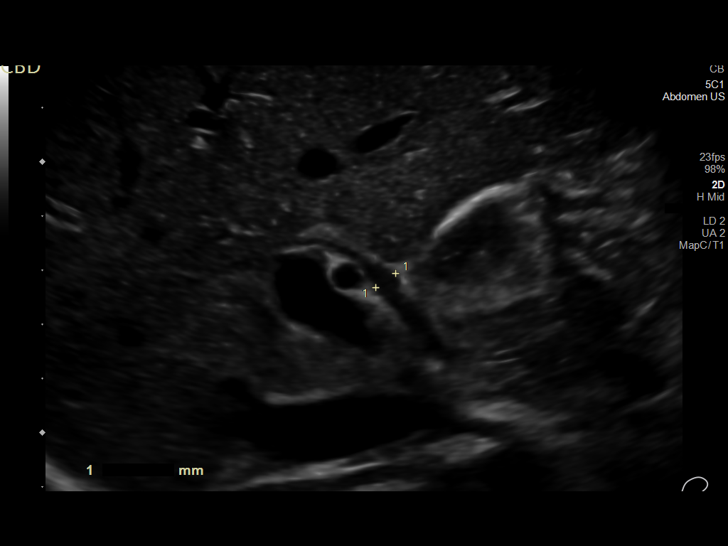
[im 25/73]
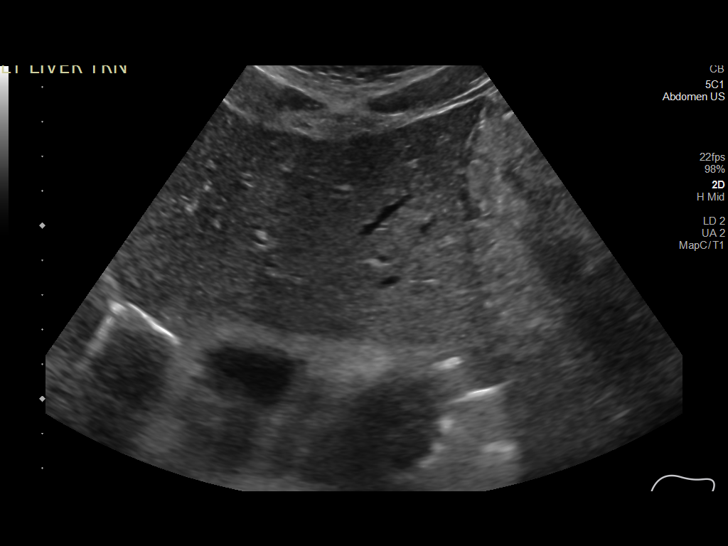
[im 28/73]
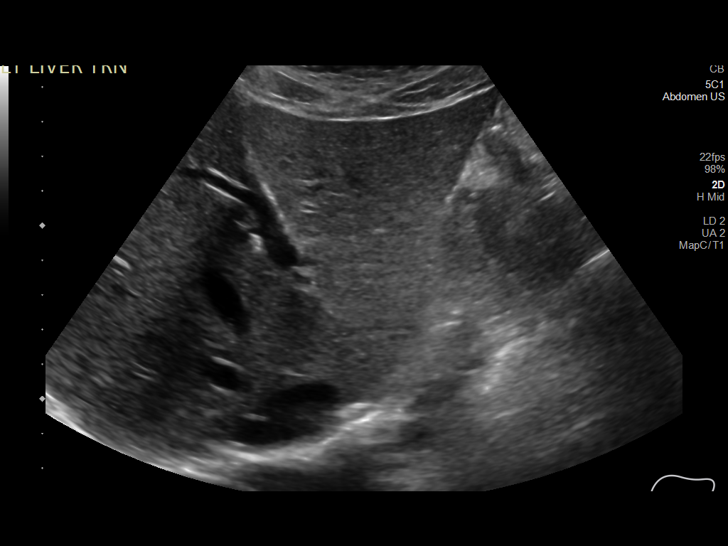
[im 34/73]
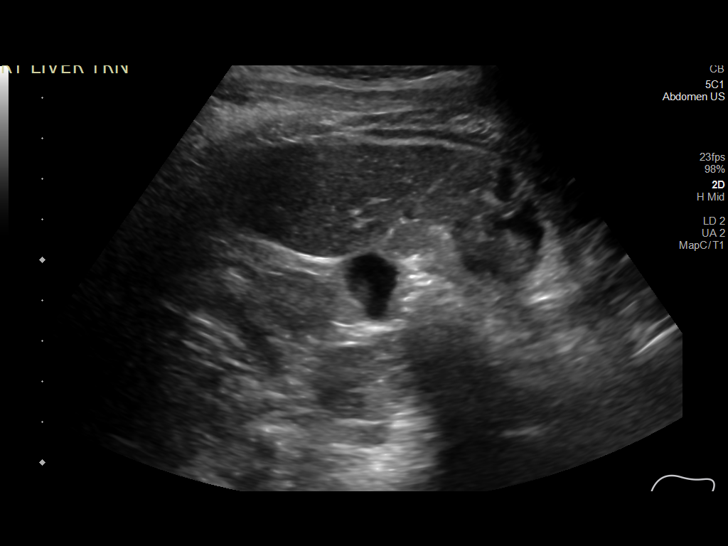
[im 40/73]
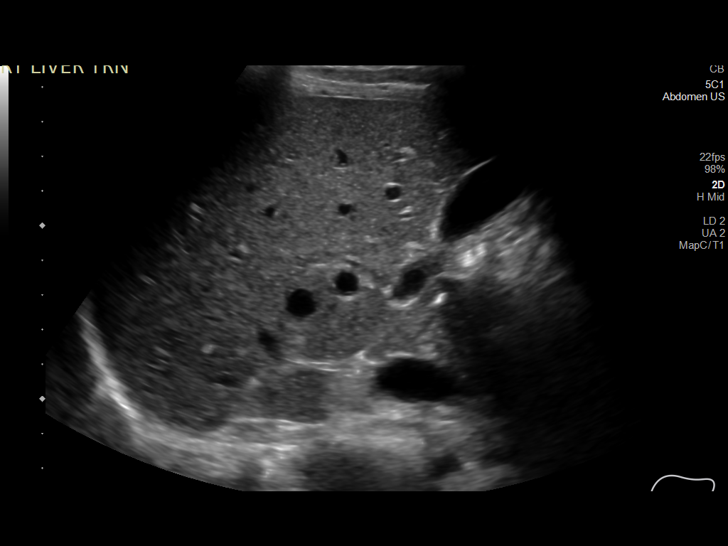
[im 46/73]
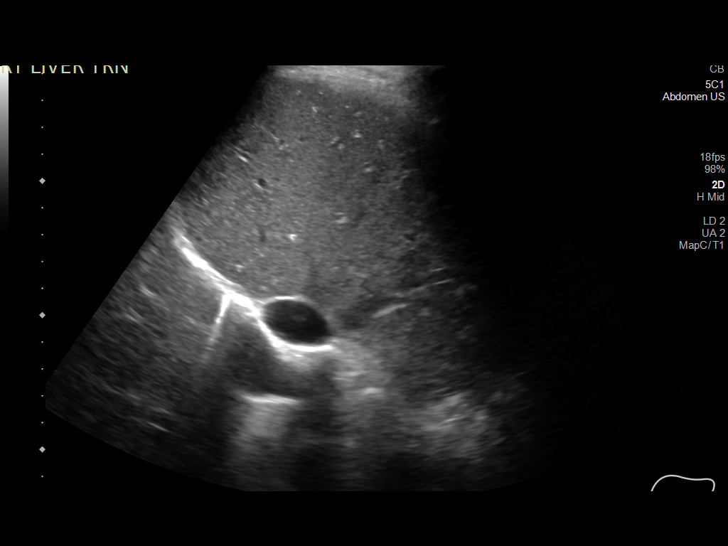
[im 49/73]
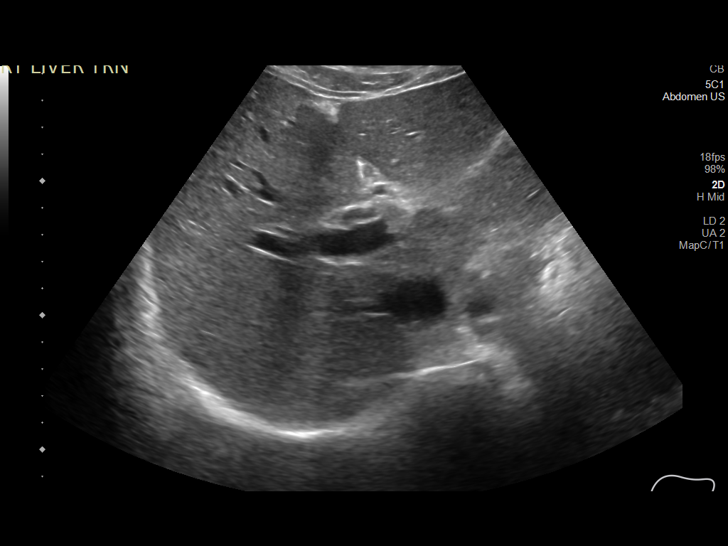
[im 55/73]
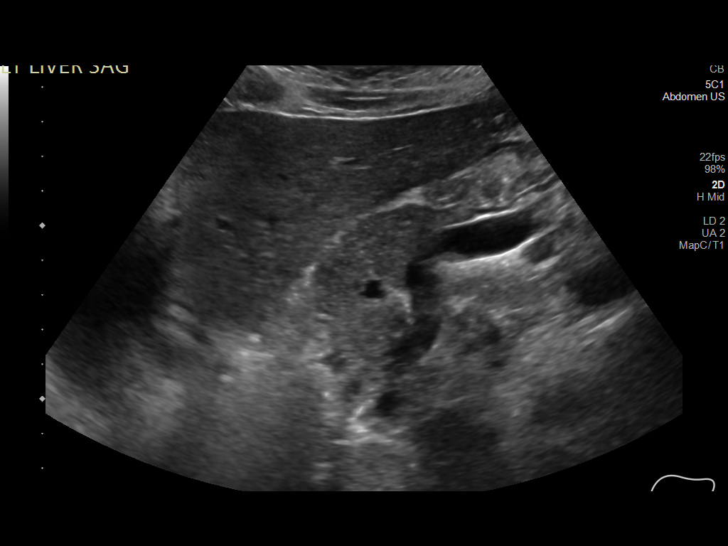
[im 61/73]
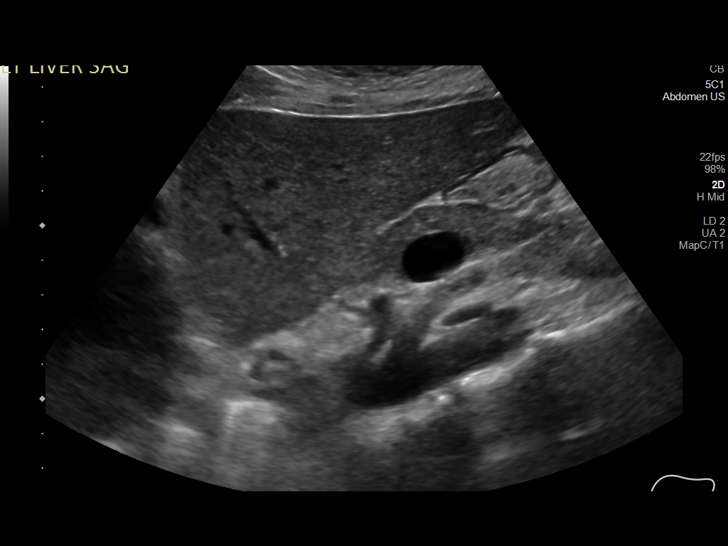
[im 67/73]
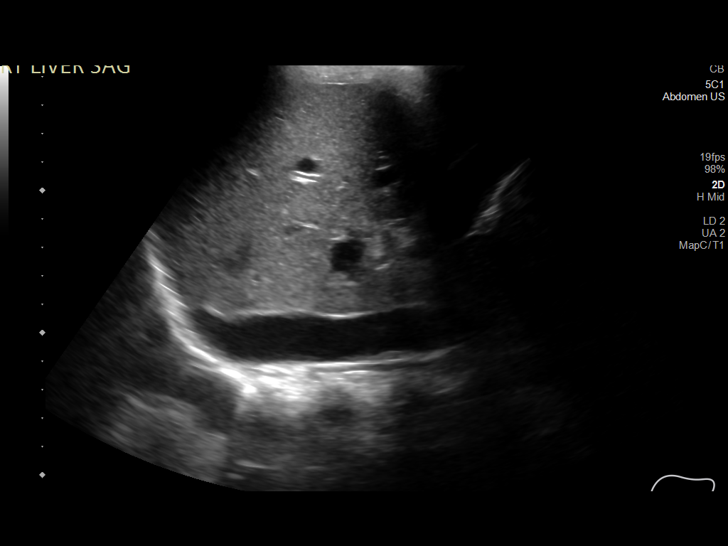
[im 73/73]
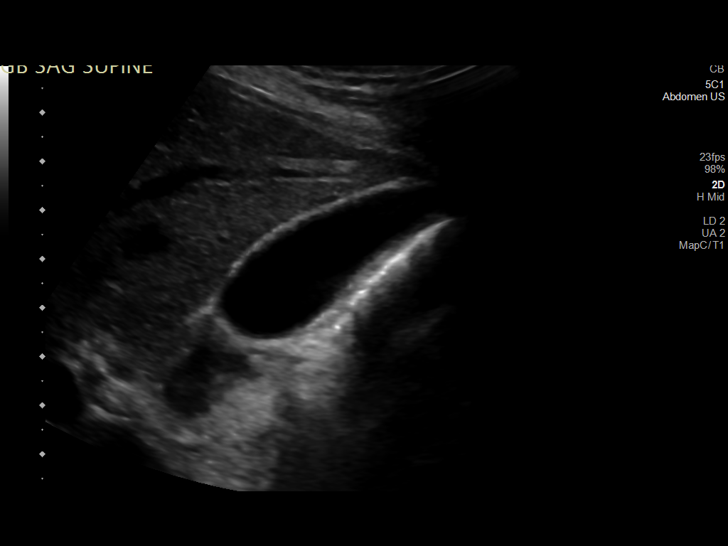

[14 of 25 positions shown; findings below may reference images not displayed]

FINDINGS: Gallbladder:

No gallstones or wall thickening visualized. No sonographic Murphy
sign noted by sonographer.

Common bile duct:

Diameter: 4.5 mm, normal

Liver:

No focal lesion identified. Within normal limits in parenchymal
echogenicity. Portal vein is patent on color Doppler imaging with
normal direction of blood flow towards the liver.

Other: None.
IMPRESSION: No evidence of cholelithiasis or acute cholecystitis.

## 2021-05-31 DIAGNOSIS — Z01419 Encounter for gynecological examination (general) (routine) without abnormal findings: Secondary | ICD-10-CM | POA: Diagnosis not present

## 2021-05-31 DIAGNOSIS — Z130 Encounter for screening for diseases of the blood and blood-forming organs and certain disorders involving the immune mechanism: Secondary | ICD-10-CM | POA: Diagnosis not present

## 2021-05-31 DIAGNOSIS — Z1389 Encounter for screening for other disorder: Secondary | ICD-10-CM | POA: Diagnosis not present

## 2021-05-31 DIAGNOSIS — Z6823 Body mass index (BMI) 23.0-23.9, adult: Secondary | ICD-10-CM | POA: Diagnosis not present

## 2023-12-12 ENCOUNTER — Telehealth: Payer: Self-pay

## 2023-12-12 ENCOUNTER — Other Ambulatory Visit: Payer: Self-pay

## 2023-12-12 NOTE — Telephone Encounter (Signed)
 Copied from CRM [id number]. Topic: General - Other >> Dec 12, 2023 11:32 AM Jayma L wrote: Reason for CRM: patient called in and stated she was told to fill out forms before appt, we had wrong address in chart for her, I updated it and she wants to know if she needs to reschedule the appt, please call her back to inform of what's needed

## 2023-12-13 ENCOUNTER — Ambulatory Visit: Admitting: Family Medicine

## 2023-12-13 ENCOUNTER — Encounter: Payer: Self-pay | Admitting: Family Medicine

## 2023-12-13 ENCOUNTER — Ambulatory Visit: Payer: Self-pay | Admitting: Family Medicine

## 2023-12-13 VITALS — BP 117/81 | HR 60 | Temp 99.2°F | Ht 62.25 in | Wt 127.6 lb

## 2023-12-13 DIAGNOSIS — R7401 Elevation of levels of liver transaminase levels: Secondary | ICD-10-CM

## 2023-12-13 DIAGNOSIS — Z0000 Encounter for general adult medical examination without abnormal findings: Secondary | ICD-10-CM

## 2023-12-13 LAB — COMPREHENSIVE METABOLIC PANEL WITH GFR
ALT: 20 U/L (ref 0–35)
AST: 21 U/L (ref 0–37)
Albumin: 4.6 g/dL (ref 3.5–5.2)
Alkaline Phosphatase: 40 U/L (ref 39–117)
BUN: 10 mg/dL (ref 6–23)
CO2: 26 meq/L (ref 19–32)
Calcium: 9.9 mg/dL (ref 8.4–10.5)
Chloride: 102 meq/L (ref 96–112)
Creatinine, Ser: 0.89 mg/dL (ref 0.40–1.20)
GFR: 86.15 mL/min (ref >60.00)
Glucose, Bld: 81 mg/dL (ref 70–99)
Potassium: 4.4 meq/L (ref 3.5–5.1)
Sodium: 136 meq/L (ref 135–145)
Total Bilirubin: 0.6 mg/dL (ref 0.2–1.2)
Total Protein: 7.3 g/dL (ref 6.0–8.3)

## 2023-12-13 LAB — CBC WITH DIFFERENTIAL/PLATELET
Basophils Absolute: 0.1 K/uL (ref 0.0–0.1)
Basophils Relative: 1 % (ref 0.0–3.0)
Eosinophils Absolute: 0.1 K/uL (ref 0.0–0.7)
Eosinophils Relative: 2.8 % (ref 0.0–5.0)
HCT: 42.8 % (ref 36.0–46.0)
Hemoglobin: 14.4 g/dL (ref 12.0–15.0)
Lymphocytes Relative: 37.7 % (ref 12.0–46.0)
Lymphs Abs: 1.9 K/uL (ref 0.7–4.0)
MCHC: 33.7 g/dL (ref 30.0–36.0)
MCV: 94.6 fl (ref 78.0–100.0)
Monocytes Absolute: 0.4 K/uL (ref 0.1–1.0)
Monocytes Relative: 8.6 % (ref 3.0–12.0)
Neutro Abs: 2.5 K/uL (ref 1.4–7.7)
Neutrophils Relative %: 49.9 % (ref 43.0–77.0)
Platelets: 232 K/uL (ref 150.0–400.0)
RBC: 4.52 Mil/uL (ref 3.87–5.11)
RDW: 12.7 % (ref 11.5–15.5)
WBC: 5 K/uL (ref 4.0–10.5)

## 2023-12-13 LAB — LIPID PANEL
Cholesterol: 195 mg/dL (ref 0–200)
HDL: 76.6 mg/dL (ref >39.00)
LDL Cholesterol: 103 mg/dL — ABNORMAL HIGH (ref 0–99)
NonHDL: 118.72
Total CHOL/HDL Ratio: 3
Triglycerides: 77 mg/dL (ref 0.0–149.0)
VLDL: 15.4 mg/dL (ref 0.0–40.0)

## 2023-12-13 LAB — TSH: TSH: 1.22 u[IU]/mL (ref 0.35–5.50)

## 2023-12-13 NOTE — Progress Notes (Signed)
 Office Note 12/13/2023  CC:  Chief Complaint  Patient presents with   Establish Care    HPI:  Heather Lawrence is a 33 y.o.female who is here to establish care, cpe. Patient's most recent primary MD: None Old records in epic/health Link EMR were reviewed prior to or during today's visit.  Heather Lawrence feels well. She is in her first trimester of her first pregnancy.  Estimated date of delivery is 08/21/2024.  She is established with [facility]. She takes a prenatal vitamin.  Past Medical History:  Diagnosis Date   Elevated LFTs    April 2022.  Possibly due to shock liver in the setting of acute gastroenteritis with dehydration.  Viral hepatitis screening negative   Psoriasis     History reviewed. No pertinent surgical history.  Family History  Problem Relation Age of Onset   Hypertension Mother    Miscarriages / Stillbirths Sister    Hypertension Maternal Grandmother    Stroke Maternal Grandmother     Social History   Socioeconomic History   Marital status: Single    Spouse name: Not on file   Number of children: Not on file   Years of education: Not on file   Highest education level: Not on file  Occupational History   Not on file  Tobacco Use   Smoking status: Never   Smokeless tobacco: Never  Vaping Use   Vaping status: Every Day  Substance and Sexual Activity   Alcohol use: Not Currently   Drug use: Never   Sexual activity: Yes    Partners: Male    Comment: married  Other Topics Concern   Not on file  Social History Narrative   Married, first child expected April 2026.   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   Social Drivers of Corporate investment banker Strain: Not on file  Food Insecurity: Not on file  Transportation Needs: Not on file  Physical Activity: Not on file  Stress: Not on file  Social Connections: Not on file  Intimate Partner Violence: Not on file    MEDS: PNV qd   No Known  Allergies  Review of Systems  Constitutional:  Negative for appetite change, chills, fatigue and fever.  HENT:  Negative for congestion, dental problem, ear pain and sore throat.   Eyes:  Negative for discharge, redness and visual disturbance.  Respiratory:  Negative for cough, chest tightness, shortness of breath and wheezing.   Cardiovascular:  Negative for chest pain, palpitations and leg swelling.  Gastrointestinal:  Negative for abdominal pain, blood in stool, diarrhea, nausea and vomiting.  Genitourinary:  Negative for difficulty urinating, dysuria, flank pain, frequency, hematuria and urgency.  Musculoskeletal:  Negative for arthralgias, back pain, joint swelling, myalgias and neck stiffness.  Skin:  Negative for pallor and rash.  Neurological:  Negative for dizziness, speech difficulty, weakness and headaches.  Hematological:  Negative for adenopathy. Does not bruise/bleed easily.  Psychiatric/Behavioral:  Negative for confusion and sleep disturbance. The patient is not nervous/anxious.    PE; Blood pressure 117/81, pulse 60, temperature 99.2 F (37.3 C), temperature source Oral, height 5' 2.25 (1.581 m), weight 127 lb 9.6 oz (57.9 kg), last menstrual period 11/15/2023, SpO2 100%. Body mass index is 23.15 kg/m.  Physical Exam Exam chaperoned by Heather Lawrence, CMA  Gen: Alert, well appearing.  Patient is oriented to person, place, time, and situation. AFFECT: pleasant, lucid thought and speech.  ENT: Ears: EACs clear, normal epithelium.  TMs with good light reflex and landmarks bilaterally.  Eyes: no injection, icteris, swelling, or exudate.  EOMI, PERRLA. Nose: no drainage or turbinate edema/swelling.  No injection or focal lesion.  Mouth: lips without lesion/swelling.  Oral mucosa pink and moist.  Dentition intact and without obvious caries or gingival swelling.  Oropharynx without erythema, exudate, or swelling.  Neck: supple/nontender.  No LAD, mass, or TM.  Carotid pulses 2+  bilaterally, without bruits. CV: RRR, no m/r/g.   LUNGS: CTA bilat, nonlabored resps, good aeration in all lung fields. ABD: soft, NT, ND, BS normal.  No hepatospenomegaly or mass.  No bruits. EXT: no clubbing, cyanosis, or edema.  Musculoskeletal: no joint swelling, erythema, warmth, or tenderness.  ROM of all joints intact. Skin - no sores or suspicious lesions or rashes or color changes  Pertinent labs:  Last CBC Lab Results  Component Value Date   WBC 6.2 09/15/2020   HGB 12.0 09/15/2020   HCT 35.2 (L) 09/15/2020   MCV 92.9 09/15/2020   MCH 31.7 09/15/2020   RDW 11.9 09/15/2020   PLT 156 09/15/2020   Last metabolic panel Lab Results  Component Value Date   GLUCOSE 88 09/15/2020   NA 137 09/15/2020   K 4.1 09/15/2020   CL 109 09/15/2020   CO2 20 (L) 09/15/2020   BUN <5 (L) 09/15/2020   CREATININE 0.83 09/15/2020   GFRNONAA >60 09/15/2020   CALCIUM 7.9 (L) 09/15/2020   PROT 7.4 10/28/2020   ALBUMIN 4.2 10/28/2020   BILITOT 0.7 10/28/2020   ALKPHOS 47 10/28/2020   AST 17 10/28/2020   ALT 12 10/28/2020   ANIONGAP 8 09/15/2020   ASSESSMENT AND PLAN:   New patient, establishing care.  #1 health maintenance exam: Reviewed age and gender appropriate health maintenance issues (prudent diet, regular exercise, health risks of tobacco and excessive alcohol, use of seatbelts, fire alarms in home, use of sunscreen).  Also reviewed age and gender appropriate health screening as well as vaccine recommendations. Vaccines: Tdap->plan is for GYN MD to give this. Labs: Cervical ca screening: per GYN Breast ca screening: start annual mammograms age 77  #2 first trimester pregnancy.  She is followed by [facility]. She takes prenatal vitamin.  #3 history of elevated transaminases. This was in the setting of a acute GI illness with dehydration back in 2022.  Presumed to have been d/t shock liver.  All viral testing was negative. The enzymes were back to normal level prior  to discharge.  An After Visit Summary was printed and given to the patient.  Signed:  Gerlene Hockey, MD           12/13/2023

## 2024-01-11 ENCOUNTER — Inpatient Hospital Stay (HOSPITAL_COMMUNITY)
Admission: AD | Admit: 2024-01-11 | Discharge: 2024-01-11 | Disposition: A | Discharge status: Home / Self Care | Attending: Obstetrics and Gynecology | Admitting: Obstetrics and Gynecology

## 2024-01-11 ENCOUNTER — Encounter (HOSPITAL_COMMUNITY): Payer: Self-pay | Admitting: *Deleted

## 2024-01-11 DIAGNOSIS — J028 Acute pharyngitis due to other specified organisms: Secondary | ICD-10-CM | POA: Diagnosis not present

## 2024-01-11 DIAGNOSIS — O99891 Other specified diseases and conditions complicating pregnancy: Secondary | ICD-10-CM | POA: Diagnosis not present

## 2024-01-11 DIAGNOSIS — Z3A08 8 weeks gestation of pregnancy: Secondary | ICD-10-CM | POA: Insufficient documentation

## 2024-01-11 DIAGNOSIS — R519 Headache, unspecified: Secondary | ICD-10-CM | POA: Diagnosis not present

## 2024-01-11 DIAGNOSIS — B9789 Other viral agents as the cause of diseases classified elsewhere: Secondary | ICD-10-CM

## 2024-01-11 DIAGNOSIS — O98511 Other viral diseases complicating pregnancy, first trimester: Secondary | ICD-10-CM | POA: Insufficient documentation

## 2024-01-11 DIAGNOSIS — J029 Acute pharyngitis, unspecified: Secondary | ICD-10-CM | POA: Diagnosis present

## 2024-01-11 HISTORY — DX: Headache, unspecified: R51.9

## 2024-01-11 LAB — URINALYSIS, ROUTINE W REFLEX MICROSCOPIC
Bilirubin Urine: NEGATIVE
Glucose, UA: NEGATIVE mg/dL
Hgb urine dipstick: NEGATIVE
Ketones, ur: NEGATIVE mg/dL
Leukocytes,Ua: NEGATIVE
Nitrite: NEGATIVE
Protein, ur: NEGATIVE mg/dL
Specific Gravity, Urine: 1.002 — ABNORMAL LOW (ref 1.005–1.030)
pH: 7 (ref 5.0–8.0)

## 2024-01-11 LAB — GROUP A STREP BY PCR: Group A Strep by PCR: NOT DETECTED

## 2024-01-11 MED ORDER — CYCLOBENZAPRINE HCL 5 MG PO TABS: 10.0000 mg | ORAL_TABLET | Freq: Once | ORAL | Status: DC

## 2024-01-11 MED ORDER — METOCLOPRAMIDE HCL 10 MG PO TABS
10.0000 mg | ORAL_TABLET | Freq: Once | ORAL | Status: AC
  Administered 2024-01-11: 10 mg via ORAL
  Filled 2024-01-11: qty 1

## 2024-01-11 MED ORDER — CAFFEINE 200 MG PO TABS: 200.0000 mg | ORAL_TABLET | Freq: Once | ORAL | Status: DC

## 2024-01-11 NOTE — MAU Provider Note (Signed)
 None     S Ms. Heather Lawrence is a 33 y.o. G1P0 female at [redacted]w[redacted]d who presents to MAU today with complaint of sore throat. Associated symptoms include headache, bilateral sinus pain, and sore throat.Onset of symptoms was 5 days ago, gradually worsening since that time. She is having trouble eating and drinking due to pain. She has not had recent close exposure to someone with proven streptococcal pharyngitis.  She was seen by her dentist on Tuesday, reports that they looked at her throat which was red, swollen, and had ulcers.  She was told that it looked viral, recommended mouthwash and Chloraseptic spray. Sore throat has not improved, and new headache today made her concerned. She called her OB and was told there was nothing they could do, so she reported to MAU for evaluation. She tried Tylenol  1000 mg at 1500 with no relief of sore throat or headache.  Receives care at [facility]. Prenatal records reviewed.  Pertinent items noted in HPI and remainder of comprehensive ROS otherwise negative.   O BP 112/88 (BP Location: Right Arm)   Pulse 82   Temp 98.9 F (37.2 C) (Oral)   Resp 15   Ht 5' (1.524 m)   Wt 58.6 kg   LMP 11/15/2023   SpO2 100%   BMI 25.23 kg/m  Physical Exam Vitals reviewed.  Constitutional:      General: She is not in acute distress.    Appearance: She is well-developed. She is not diaphoretic.  HENT:     Head: Normocephalic and atraumatic.     Right Ear: Tympanic membrane and ear canal normal. No swelling or tenderness. No middle ear effusion. Tympanic membrane is not erythematous.     Left Ear: Tympanic membrane and ear canal normal. No swelling or tenderness.  No middle ear effusion. Tympanic membrane is not erythematous.     Nose: No rhinorrhea.     Right Sinus: No maxillary sinus tenderness or frontal sinus tenderness.     Left Sinus: No maxillary sinus tenderness or frontal sinus tenderness.     Mouth/Throat:     Mouth: Mucous membranes are moist. No oral  lesions.     Pharynx: Posterior oropharyngeal erythema present.     Tonsils: No tonsillar exudate or tonsillar abscesses.  Eyes:     General: No scleral icterus.    Conjunctiva/sclera: Conjunctivae normal.     Pupils: Pupils are equal, round, and reactive to light.  Cardiovascular:     Rate and Rhythm: Normal rate and regular rhythm.     Heart sounds: No murmur heard.    No friction rub. No gallop.  Pulmonary:     Effort: Pulmonary effort is normal. No respiratory distress.     Breath sounds: No wheezing, rhonchi or rales.  Musculoskeletal:     Cervical back: Normal range of motion and neck supple.  Lymphadenopathy:     Cervical: No cervical adenopathy.  Skin:    General: Skin is warm and dry.  Neurological:     General: No focal deficit present.     Mental Status: She is alert and oriented to person, place, and time.     Coordination: Coordination normal.  Psychiatric:        Mood and Affect: Mood normal.        Behavior: Behavior normal.    Results for orders placed or performed during the hospital encounter of 01/11/24 (from the past 24 hours)  Urinalysis, Routine w reflex microscopic -Urine, Clean Catch  Status: Abnormal   Collection Time: 01/11/24  4:19 PM  Result Value Ref Range   Color, Urine COLORLESS (A) YELLOW   APPearance CLEAR CLEAR   Specific Gravity, Urine 1.002 (L) 1.005 - 1.030   pH 7.0 5.0 - 8.0   Glucose, UA NEGATIVE NEGATIVE mg/dL   Hgb urine dipstick NEGATIVE NEGATIVE   Bilirubin Urine NEGATIVE NEGATIVE   Ketones, ur NEGATIVE NEGATIVE mg/dL   Protein, ur NEGATIVE NEGATIVE mg/dL   Nitrite NEGATIVE NEGATIVE   Leukocytes,Ua NEGATIVE NEGATIVE  Group A Strep by PCR     Status: None   Collection Time: 01/11/24  4:19 PM  Result Value Ref Range   Group A Strep by PCR NOT DETECTED NOT DETECTED    MDM: Low MAU Course: -Vital signs within normal limits. Afebrile. -Urine pale yellow color on collection. Checking UA for hydration status. -Testing for  strep pharyngitis. -Trial of Reglan  add-on for headache while awaiting results. -UA shows excellent hydration (colorless with specific gravity 1.002). -Headache unchanged after Reglan . Discussed adding caffeine  tablet and Flexeril . -Negative strep test, clinical exam not suspicious for false negative test.  A 1. Sore throat (viral) (Primary) - Discharge patient  2. Acute nonintractable headache, unspecified headache type - Discharge patient  3. [redacted] weeks gestation of pregnancy - Discharge patient   Medical screening exam complete  P Discharge from MAU in stable condition with return precautions Follow up at [facility] as scheduled for ongoing prenatal care Discussed with negative strep throat test, symptoms likely due to virus. Recommend supportive care and symptom management. Provided list of OTC medications that are safe in pregnancy.  No future appointments. Allergies as of 01/11/2024   No Known Allergies      Medication List     TAKE these medications    acetaminophen  500 MG tablet Commonly known as: TYLENOL  Take 1,000 mg by mouth every 6 (six) hours as needed.   phenol 1.4 % Liqd Commonly known as: CHLORASEPTIC Use as directed 1 spray in the mouth or throat as needed for throat irritation / pain.   PRENATAL PO Take by mouth daily.        Joesph DELENA Sear, PA

## 2024-01-11 NOTE — Discharge Instructions (Addendum)
 Thank you for trusting us  with your care today, and thank you for your patience.   The test for strep throat is negative, so a virus is the most likely cause of your symptoms. While your body fights off the virus, we can do everything we can to reduce your symptoms.  Sore Throat & Headache Honey - as much as you want! This has antimicrobial properties and also coats the throat to help with symptoms. I typically recommend adding it to warm water with lemon juice, or adding it to hot tea. Teas which coat the throat - look for ingredients Elm Bark, Licorice Root, Marshmallow Root Warm beverages, warm soup Alcohol-free cough drops/lozenges Chloraseptic spray OTC Acetaminophen  (Tylenol ) 500 mg tablets - take max 2 tablets (1000 mg) every 6 hours (4 times per day)  I would start this on a schedule to control the pain while your body fights off this virus! If you want to take something that is more effective for the headache than Tylenol  alone, you can take Excedrin-Tension instead of Tylenol . Look for TENSION headache formulation. The main ingredients should be Acetaminophen  (same as Tylenol ) and Caffeine ; should NOT have Aspirin in the formulation  If you need to you can also take Benadryl. This will likely make you very sleepy. Please take a nap as sleep can help headaches go away.   I am sorry you are not feeling well, and I hope you feel better soon!     Safe Medications in Pregnancy  Acne:  Benzoyl Peroxide  Salicylic Acid   Pain/Headache:  Tylenol : 2 regular strength every 4 hours OR               2 Extra strength every 6 hours   Colds/Coughs/Allergies:  Benadryl (alcohol free) 25 mg every 6 hours as needed  Breath right strips  Claritin  Cepacol throat lozenges  Chloraseptic throat spray  Cold-Eeze- up to three times per day  Cough drops, alcohol free  Flonase (by prescription only)  Guaifenesin  Mucinex  Robitussin DM (plain only, alcohol free)  Saline nasal spray/drops   Sudafed (pseudoephedrine) & Actifed * use only after [redacted] weeks gestation and if you do not have high blood pressure  Tylenol   Vicks Vaporub  Zinc lozenges  Zyrtec   Constipation:  Colace  Ducolax suppositories  Fleet enema  Glycerin suppositories  Metamucil  Milk of magnesia  Miralax  Senokot  Smooth move tea   Diarrhea:  Kaopectate  Imodium A-D   *NO pepto Bismol   Hemorrhoids:  Anusol  Anusol HC  Preparation H  Tucks   Indigestion:  Tums  Maalox  Mylanta  Zantac  Pepcid    Insomnia:  Benadryl (alcohol free) 25mg  every 6 hours as needed  Tylenol  PM  Unisom, no Gelcaps   Leg Cramps:  Tums  MagGel   Nausea/Vomiting:  Bonine  Dramamine  Emetrol  Ginger extract  Sea bands  Meclizine  Nausea medication to take during pregnancy:  Unisom (doxylamine succinate 25 mg tablets) Take one tablet daily at bedtime. If symptoms are not adequately controlled, the dose can be increased to a maximum recommended dose of two tablets daily (1/2 tablet in the morning, 1/2 tablet mid-afternoon and one at bedtime).  Vitamin B6 100mg  tablets. Take one tablet twice a day (up to 200 mg per day).   Skin Rashes:  Aveeno products  Benadryl cream or 25mg  every 6 hours as needed  Calamine Lotion  1% cortisone cream   Yeast infection:  Gyne-lotrimin  7  Monistat 7   **If taking multiple medications, please check labels to avoid duplicating the same active ingredients  **take medication as directed on the label  ** Do not exceed 4000 mg of tylenol  in 24 hours  **Do not take medications that contain aspirin or ibuprofen

## 2024-01-11 NOTE — MAU Note (Addendum)
 Heather Lawrence is a 33 y.o. at [redacted]w[redacted]d here in MAU reporting: Sunday, her throat started hurting. Was at her dentist on Tues, asked her to look at her throat.  Was told her tonsils were red, swollen and their were ulcers on them. Looked viral.  She recommended some  mouth wash and chloraseptic spray.  Was told if it wasn't better in a few days to call her dr.  [name] yesterday. Talked to [facility] today, was told nothing they could do.  She is having problems eating and drinking due to the pain.  Reporting HA, took Tylenol  around 1500 for it, no relief.  Onset of complaint: Sunday Pain score: 7, throat- worse on left side.HA   BP 103/70, P 84, R 15, T 98.9 o2 Sat 100% on RA  Lab ordered in triage; urine and throat swab    Urine is very pale, pt reports only second time she has gone today.

## 2024-03-19 ENCOUNTER — Ambulatory Visit: Admitting: Family Medicine

## 2024-03-19 VITALS — BP 116/78 | HR 92 | Temp 98.5°F | Wt 137.4 lb

## 2024-03-19 DIAGNOSIS — H6992 Unspecified Eustachian tube disorder, left ear: Secondary | ICD-10-CM | POA: Diagnosis not present

## 2024-03-19 DIAGNOSIS — H9209 Otalgia, unspecified ear: Secondary | ICD-10-CM

## 2024-03-19 NOTE — Progress Notes (Signed)
 Heather Lawrence , Aug 16, 1990, 33 y.o., female MRN: [id number] Patient Care Team    Relationship Specialty Notifications Start End  McGowen, Aleene DEL, MD PCP - General Family Medicine  12/13/23   Danielle Rom, MD Consulting Physician Obstetrics and Gynecology  09/09/14     Chief Complaint  Patient presents with   Ear Pain    3 weeks ago; L ear. Pt has not tried anything to relieve sx.      Subjective: Heather Lawrence is a 33 y.o. G1P0 [redacted]w[redacted]d Pt presents for an OV with complaints of left ear pain of 3 weeks duration.  Associated symptoms include ear fullness.  She reports she had a cold last week.  She had been taking Zyrtec and Flonase for a few days. She denies fevers or chills.     12/13/2023   10:14 AM  Depression screen PHQ 2/9  Decreased Interest 0  Down, Depressed, Hopeless 0  PHQ - 2 Score 0    No Known Allergies Social History   Social History Narrative   Married, first child expected April 2026.   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   Past Medical History:  Diagnosis Date   Elevated LFTs    April 2022.  Possibly due to shock liver in the setting of acute gastroenteritis with dehydration.  Viral hepatitis screening negative   Headache    Psoriasis    Past Surgical History:  Procedure Laterality Date   NO PAST SURGERIES     Family History  Problem Relation Age of Onset   Hypertension Mother    Healthy Father    Miscarriages / Stillbirths Sister    Hypertension Maternal Grandmother    Stroke Maternal Grandmother    Allergies as of 03/19/2024   No Known Allergies      Medication List        Accurate as of March 19, 2024  2:37 PM. If you have any questions, ask your nurse or doctor.          STOP taking these medications    phenol 1.4 % Liqd Commonly known as: CHLORASEPTIC       TAKE these medications    acetaminophen  500 MG tablet Commonly known as: TYLENOL  Take 1,000 mg by mouth  every 6 (six) hours as needed.   PRENATAL PO Take by mouth daily.        All past medical history, surgical history, allergies, family history, immunizations andmedications were updated in the EMR today and reviewed under the history and medication portions of their EMR.     ROS Negative, with the exception of above mentioned in HPI   Objective:  BP 116/78   Pulse 92   Temp 98.5 F (36.9 C)   Wt 137 lb 6.4 oz (62.3 kg)   LMP 11/15/2023   SpO2 99%   BMI 26.83 kg/m  Body mass index is 26.83 kg/m. Physical Exam Vitals and nursing note reviewed.  Constitutional:      General: She is not in acute distress.    Appearance: Normal appearance. She is normal weight. She is not ill-appearing or toxic-appearing.  HENT:     Head: Normocephalic and atraumatic.     Right Ear: Tympanic membrane, ear canal and external ear normal. There is no impacted cerumen.     Left Ear: Tympanic membrane, ear canal and external ear normal. There is no impacted cerumen.  Nose: No congestion or rhinorrhea.  Eyes:     General: No scleral icterus.       Right eye: No discharge.        Left eye: No discharge.     Extraocular Movements: Extraocular movements intact.     Conjunctiva/sclera: Conjunctivae normal.     Pupils: Pupils are equal, round, and reactive to light.  Musculoskeletal:     Cervical back: Neck supple.  Lymphadenopathy:     Cervical: No cervical adenopathy.  Skin:    Findings: No rash.  Neurological:     Mental Status: She is alert and oriented to person, place, and time. Mental status is at baseline.     Motor: No weakness.     Coordination: Coordination normal.     Gait: Gait normal.  Psychiatric:        Mood and Affect: Mood normal.        Behavior: Behavior normal.        Thought Content: Thought content normal.        Judgment: Judgment normal.      No results found. No results found. No results found for this or any previous visit (from the past 24  hours).  Assessment/Plan: Heather Lawrence is a 33 y.o. female present for OV for  Otalgia, unspecified laterality (Primary)/Eustachian tube dysfunction, left Your exam is normal, small amount of clear fluid behind the ear likely causing discomfort. We discussed Galbreath maneuver. Encouraged her to discuss with her OB if okay to use Flonase for a few more days and an antihistamine. Patient reports understanding.  Reviewed expectations re: course of current medical issues. Discussed self-management of symptoms. Outlined signs and symptoms indicating need for more acute intervention. Patient verbalized understanding and all questions were answered. Patient received an After-Visit Summary.    No orders of the defined types were placed in this encounter.  No orders of the defined types were placed in this encounter.  Referral Orders  No referral(s) requested today     Note is dictated utilizing voice recognition software. Although note has been proof read prior to signing, occasional typographical errors still can be missed. If any questions arise, please do not hesitate to call for verification.   electronically signed by:  Charlies Bellini, DO  [facility] [place]

## 2024-03-19 NOTE — Patient Instructions (Signed)

## 2024-04-12 ENCOUNTER — Other Ambulatory Visit: Payer: Self-pay | Admitting: Student

## 2024-04-12 DIAGNOSIS — O35BXX0 Maternal care for other (suspected) fetal abnormality and damage, fetal cardiac anomalies, not applicable or unspecified: Secondary | ICD-10-CM

## 2024-04-12 DIAGNOSIS — Z3A19 19 weeks gestation of pregnancy: Secondary | ICD-10-CM

## 2024-05-09 ENCOUNTER — Ambulatory Visit
